# Patient Record
Sex: Male | Born: 1990 | Race: White | Hispanic: No | Marital: Married | Smoking: Never smoker
Health system: Southern US, Community
[De-identification: ages and names within clinical notes are randomized; demographics above are authoritative.]

## PROBLEM LIST (undated history)

## (undated) DIAGNOSIS — K8020 Calculus of gallbladder without cholecystitis without obstruction: Secondary | ICD-10-CM

## (undated) DIAGNOSIS — I10 Essential (primary) hypertension: Secondary | ICD-10-CM

## (undated) DIAGNOSIS — K7689 Other specified diseases of liver: Secondary | ICD-10-CM

## (undated) DIAGNOSIS — G4730 Sleep apnea, unspecified: Secondary | ICD-10-CM

## (undated) HISTORY — DX: Other specified diseases of liver: K76.89

## (undated) HISTORY — DX: Essential (primary) hypertension: I10

## (undated) HISTORY — PX: CHOLECYSTECTOMY: SHX55

---
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
## 2009-09-01 ENCOUNTER — Emergency Department (HOSPITAL_COMMUNITY): Admission: EM | Admit: 2009-09-01 | Discharge: 2009-09-01 | Payer: Self-pay | Admitting: Emergency Medicine

## 2011-01-21 LAB — URINALYSIS, ROUTINE W REFLEX MICROSCOPIC
Bilirubin Urine: NEGATIVE
Glucose, UA: NEGATIVE
Hgb urine dipstick: NEGATIVE
Ketones, ur: NEGATIVE
Nitrite: NEGATIVE
Protein, ur: NEGATIVE
Specific Gravity, Urine: 1.014
Urobilinogen, UA: 1
pH: 7

## 2011-01-21 LAB — CBC
HCT: 41.8
Hemoglobin: 14.4
MCHC: 34.3
MCV: 89.1
Platelets: 319
RBC: 4.7
RDW: 13
WBC: 17.3 — ABNORMAL HIGH

## 2011-01-21 LAB — COMPREHENSIVE METABOLIC PANEL
ALT: 48
AST: 52 — ABNORMAL HIGH
Albumin: 4.6
Alkaline Phosphatase: 77
BUN: 9
CO2: 26
Calcium: 9.8
Chloride: 103
Creatinine, Ser: 0.96
GFR calc Af Amer: >60
GFR calc non Af Amer: >60
Glucose, Bld: 93
Potassium: 3.9
Sodium: 140
Total Bilirubin: 0.9
Total Protein: 7

## 2011-01-21 LAB — DIFFERENTIAL
Basophils Absolute: 0.1
Basophils Relative: 0
Eosinophils Absolute: 0.1
Eosinophils Relative: 0
Lymphocytes Relative: 11 — ABNORMAL LOW
Lymphs Abs: 1.9
Monocytes Absolute: 1.5 — ABNORMAL HIGH
Monocytes Relative: 9
Neutro Abs: 13.7 — ABNORMAL HIGH
Neutrophils Relative %: 80 — ABNORMAL HIGH

## 2011-01-21 LAB — LIPASE, BLOOD: Lipase: 25

## 2013-03-11 ENCOUNTER — Emergency Department (HOSPITAL_COMMUNITY)
Admission: EM | Admit: 2013-03-11 | Discharge: 2013-03-12 | Disposition: A | Discharge status: Home / Self Care | Payer: Self-pay | Attending: Emergency Medicine | Admitting: Emergency Medicine

## 2013-03-11 ENCOUNTER — Encounter (HOSPITAL_COMMUNITY): Payer: Self-pay | Admitting: Emergency Medicine

## 2013-03-11 DIAGNOSIS — S61209A Unspecified open wound of unspecified finger without damage to nail, initial encounter: Secondary | ICD-10-CM | POA: Insufficient documentation

## 2013-03-11 DIAGNOSIS — Y9389 Activity, other specified: Secondary | ICD-10-CM | POA: Insufficient documentation

## 2013-03-11 DIAGNOSIS — S61219A Laceration without foreign body of unspecified finger without damage to nail, initial encounter: Secondary | ICD-10-CM

## 2013-03-11 DIAGNOSIS — Y990 Civilian activity done for income or pay: Secondary | ICD-10-CM | POA: Insufficient documentation

## 2013-03-11 DIAGNOSIS — W268XXA Contact with other sharp object(s), not elsewhere classified, initial encounter: Secondary | ICD-10-CM | POA: Insufficient documentation

## 2013-03-11 DIAGNOSIS — Y9289 Other specified places as the place of occurrence of the external cause: Secondary | ICD-10-CM | POA: Insufficient documentation

## 2013-03-11 DIAGNOSIS — Z23 Encounter for immunization: Secondary | ICD-10-CM | POA: Insufficient documentation

## 2013-03-11 MED ORDER — LIDOCAINE HCL (PF) 1 % IJ SOLN
5.0000 mL | Freq: Once | INTRAMUSCULAR | Status: AC
  Administered 2013-03-12: 5 mL
  Filled 2013-03-11: qty 5

## 2013-03-11 MED ORDER — TETANUS-DIPHTH-ACELL PERTUSSIS 5-2.5-18.5 LF-MCG/0.5 IM SUSP
0.5000 mL | Freq: Once | INTRAMUSCULAR | Status: AC
  Administered 2013-03-12: 0.5 mL via INTRAMUSCULAR
  Filled 2013-03-11: qty 0.5

## 2013-03-11 NOTE — ED Notes (Signed)
RIF lac 3pm today on a razor at work.

## 2013-03-12 MED ORDER — HYDROCODONE-ACETAMINOPHEN 5-325 MG PO TABS: 1.0000 | ORAL_TABLET | ORAL | Status: DC | PRN

## 2013-03-12 MED ORDER — HYDROCODONE-ACETAMINOPHEN 5-325 MG PO TABS: 2.0000 | ORAL_TABLET | ORAL | Status: DC | PRN

## 2013-03-12 NOTE — ED Provider Notes (Signed)
CSN: [id number]     Arrival date & time 03/11/13  2255 History   First MD Initiated Contact with Patient 03/11/13 2259     Chief Complaint  Patient presents with  . Laceration   (Consider location/radiation/quality/duration/timing/severity/associated sxs/prior Treatment) Patient is a 22 y.o. male presenting with skin laceration. The history is provided by the patient.  Laceration Location:  Hand Hand laceration location:  R finger Length (cm):  2.3 Depth:  Cutaneous Bleeding: controlled   Time since incident:  7 hours Laceration mechanism:  Razor Pain details:    Quality:  Throbbing   Severity:  Moderate   Timing:  Constant   Progression:  Worsening Foreign body present:  No foreign bodies Relieved by:  Nothing Ineffective treatments:  None tried Tetanus status:  Out of date   History reviewed. No pertinent past medical history. History reviewed. No pertinent past surgical history. History reviewed. No pertinent family history. History  Substance Use Topics  . Smoking status: Never Smoker   . Smokeless tobacco: Not on file  . Alcohol Use: Yes     Comment: rarely    Review of Systems  Constitutional: Negative for activity change.       All ROS Neg except as noted in HPI  HENT: Negative for nosebleeds.   Eyes: Negative for photophobia and discharge.  Respiratory: Negative for cough, shortness of breath and wheezing.   Cardiovascular: Negative for chest pain and palpitations.  Gastrointestinal: Negative for abdominal pain and blood in stool.  Genitourinary: Negative for dysuria, frequency and hematuria.  Musculoskeletal: Negative for arthralgias, back pain and neck pain.  Skin: Negative.   Neurological: Negative for dizziness, seizures and speech difficulty.  Psychiatric/Behavioral: Negative for hallucinations and confusion.    Allergies  Review of patient's allergies indicates no known allergies.  Home Medications  No current outpatient prescriptions on  file. BP 148/97  Pulse 88  Temp(Src) 98.1 F (36.7 C) (Oral)  Resp 18  Ht 5\' 10"  (1.778 m)  Wt 320 lb (145.151 kg)  BMI 45.92 kg/m2  SpO2 99% Physical Exam  Nursing note and vitals reviewed. Constitutional: He is oriented to person, place, and time. He appears well-developed and well-nourished.  Non-toxic appearance.  HENT:  Head: Normocephalic.  Right Ear: Tympanic membrane and external ear normal.  Left Ear: Tympanic membrane and external ear normal.  Eyes: EOM and lids are normal. Pupils are equal, round, and reactive to light.  Neck: Normal range of motion. Neck supple. Carotid bruit is not present.  Cardiovascular: Normal rate, regular rhythm, normal heart sounds, intact distal pulses and normal pulses.   Pulmonary/Chest: Breath sounds normal. No respiratory distress.  Abdominal: Soft. Bowel sounds are normal. There is no tenderness. There is no guarding.  Musculoskeletal: Normal range of motion.  Laceration of the lateral/palmar surface of the right index finger. No tendon or bone involvement.  Lymphadenopathy:       Head (right side): No submandibular adenopathy present.       Head (left side): No submandibular adenopathy present.    He has no cervical adenopathy.  Neurological: He is alert and oriented to person, place, and time. He has normal strength. No cranial nerve deficit or sensory deficit.  Skin: Skin is warm and dry.  Psychiatric: He has a normal mood and affect. His speech is normal.    ED Course  LACERATION REPAIR Date/Time: 03/12/2013 12:38 AM Performed by: Kathie Dike Authorized by: Kathie Dike Consent: Verbal consent obtained. Risks and benefits: risks, benefits  and alternatives were discussed Consent given by: patient Patient identity confirmed: arm band Time out: Immediately prior to procedure a "time out" was called to verify the correct patient, procedure, equipment, support staff and site/side marked as required. Body area: upper  extremity Location details: right index finger Laceration length: 2.3 cm Foreign bodies: no foreign bodies Tendon involvement: none Nerve involvement: none Anesthesia: digital block Local anesthetic: lidocaine 1% without epinephrine Preparation: Patient was prepped and draped in the usual sterile fashion. Irrigation solution: saline Amount of cleaning: standard Skin closure: 4-0 nylon Number of sutures: 6 Technique: simple Approximation: close Approximation difficulty: simple Dressing: gauze roll Patient tolerance: Patient tolerated the procedure well with no immediate complications.   (including critical care time) Labs Review Labs Reviewed - No data to display Imaging Review No results found.  EKG Interpretation   None       MDM  No diagnosis found. **I have reviewed nursing notes, vital signs, and all appropriate lab and imaging results for this patient.* No bone or tendon involvement from the laceration to the Rt index finger. Laceration to the right index finger repaired. Tetanus updated. Pt will return in 7 to 8 days for suture removal. He will return sooner if any changes  Kathie Dike, PA-C 03/13/13 1133

## 2013-03-13 NOTE — ED Provider Notes (Signed)
Medical screening examination/treatment/procedure(s) were performed by non-physician practitioner and as supervising physician I was immediately available for consultation/collaboration.    Sunnie Nielsen, MD 03/13/13 [phone]

## 2013-03-21 MED FILL — Hydrocodone-Acetaminophen Tab 5-325 MG: ORAL | Qty: 6 | Status: AC

## 2017-04-29 ENCOUNTER — Encounter: Payer: Self-pay | Admitting: Family Medicine

## 2017-04-29 ENCOUNTER — Ambulatory Visit (INDEPENDENT_AMBULATORY_CARE_PROVIDER_SITE_OTHER): Payer: Commercial Managed Care - PPO | Admitting: Family Medicine

## 2017-04-29 VITALS — BP 134/88 | Temp 98.0°F | Ht 70.0 in | Wt 314.8 lb

## 2017-04-29 DIAGNOSIS — Z79899 Other long term (current) drug therapy: Secondary | ICD-10-CM | POA: Diagnosis not present

## 2017-04-29 DIAGNOSIS — M109 Gout, unspecified: Secondary | ICD-10-CM | POA: Diagnosis not present

## 2017-04-29 DIAGNOSIS — Z13220 Encounter for screening for lipoid disorders: Secondary | ICD-10-CM | POA: Diagnosis not present

## 2017-04-29 MED ORDER — PREDNISONE 20 MG PO TABS: ORAL_TABLET | ORAL | 0 refills | Status: DC

## 2017-04-29 NOTE — Patient Instructions (Addendum)
Is some scarring and your prednisone taper and some blood work on low-Purine Diet Purines are compounds that affect the level of uric acid in your body. A low-purine diet is a diet that is low in purines. Eating a low-purine diet can prevent the level of uric acid in your body from getting too high and causing gout or kidney stones or both. What do I need to know about this diet?  Choose low-purine foods. Examples of low-purine foods are listed in the next section.  Drink plenty of fluids, especially water. Fluids can help remove uric acid from your body. Try to drink 8-16 cups (1.9-3.8 L) a day.  Limit foods high in fat, especially saturated fat, as fat makes it harder for the body to get rid of uric acid. Foods high in saturated fat include pizza, cheese, ice cream, whole milk, fried foods, and gravies. Choose foods that are lower in fat and lean sources of protein. Use olive oil when cooking as it contains healthy fats that are not high in saturated fat.  Limit alcohol. Alcohol interferes with the elimination of uric acid from your body. If you are having a gout attack, avoid all alcohol.  Keep in mind that different people's bodies react differently to different foods. You will probably learn over time which foods do or do not affect you. If you discover that a food tends to cause your gout to flare up, avoid eating that food. You can more freely enjoy foods that do not cause problems. If you have any questions about a food item, talk to your dietitian or health care provider. Which foods are low, moderate, and high in purines? The following is a list of foods that are low, moderate, and high in purines. You can eat any amount of the foods that are low in purines. You may be able to have small amounts of foods that are moderate in purines. Ask your health care provider how much of a food moderate in purines you can have. Avoid foods high in purines. Grains  Foods low in purines: Enriched white  bread, pasta, rice, cake, cornbread, popcorn.  Foods moderate in purines: Whole-grain breads and cereals, wheat germ, bran, oatmeal. Uncooked oatmeal. Dry wheat bran or wheat germ.  Foods high in purines: Pancakes, [place] toast, biscuits, muffins. Vegetables  Foods low in purines: All vegetables, except those that are moderate in purines.  Foods moderate in purines: Asparagus, cauliflower, spinach, mushrooms, green peas. Fruits  All fruits are low in purines. Meats and other Protein Foods  Foods low in purines: Eggs, nuts, peanut butter.  Foods moderate in purines: 80-90% lean beef, lamb, veal, pork, poultry, fish, eggs, peanut butter, nuts. Crab, lobster, oysters, and shrimp. Cooked dried beans, peas, and lentils.  Foods high in purines: Anchovies, sardines, herring, mussels, tuna, codfish, scallops, trout, and haddock. Marvin Rivera. Organ meats (such as liver or kidney). Tripe. Game meat. Goose. Sweetbreads. Dairy  All dairy foods are low in purines. Low-fat and fat-free dairy products are best because they are low in saturated fat. Beverages  Drinks low in purines: Water, carbonated beverages, tea, coffee, cocoa.  Drinks moderate in purines: Soft drinks and other drinks sweetened with high-fructose corn syrup. Juices. To find whether a food or drink is sweetened with high-fructose corn syrup, look at the ingredients list.  Drinks high in purines: Alcoholic beverages (such as beer). Condiments  Foods low in purines: Salt, herbs, olives, pickles, relishes, vinegar.  Foods moderate in purines: Butter, margarine, oils, mayonnaise.  Fats and Oils  Foods low in purines: All types, except gravies and sauces made with meat.  Foods high in purines: Gravies and sauces made with meat. Other Foods  Foods low in purines: Sugars, sweets, gelatin. Cake. Soups made without meat.  Foods moderate in purines: Meat-based or fish-based soups, broths, or bouillons. Foods and drinks sweetened with  high-fructose corn syrup.  Foods high in purines: High-fat desserts (such as ice cream, cookies, cakes, pies, doughnuts, and chocolate). Contact your dietitian for more information on foods that are not listed here. This information is not intended to replace advice given to you by your health care provider. Make sure you discuss any questions you have with your health care provider. Document Released: 08/09/2010 Document Revised: 09/20/2015 Document Reviewed: 03/21/2013 Elsevier Interactive Patient Education  2017 [employer] Gout Gout is painful swelling that can occur in some of your joints. Gout is a type of arthritis. This condition is caused by having too much uric acid in your body. Uric acid is a chemical that forms when your body breaks down substances called purines. Purines are important for building body proteins. When your body has too much uric acid, sharp crystals can form and build up inside your joints. This causes pain and swelling. Gout attacks can happen quickly and be very painful (acute gout). Over time, the attacks can affect more joints and become more frequent (chronic gout). Gout can also cause uric acid to build up under your skin and inside your kidneys. What are the causes? This condition is caused by too much uric acid in your blood. This can occur because:  Your kidneys do not remove enough uric acid from your blood. This is the most common cause.  Your body makes too much uric acid. This can occur with some cancers and cancer treatments. It can also occur if your body is breaking down too many red blood cells (hemolytic anemia).  You eat too many foods that are high in purines. These foods include organ meats and some seafood. Alcohol, especially beer, is also high in purines.  A gout attack may be triggered by trauma or stress. What increases the risk? This condition is more likely to develop in people who:  Have a family history of gout.  Are male and  middle-aged.  Are male and have gone through menopause.  Are obese.  Frequently drink alcohol, especially beer.  Are dehydrated.  Lose weight too quickly.  Have an organ transplant.  Have lead poisoning.  Take certain medicines, including aspirin, cyclosporine, diuretics, levodopa, and niacin.  Have kidney disease or psoriasis.  What are the signs or symptoms? An attack of acute gout happens quickly. It usually occurs in just one joint. The most common place is the big toe. Attacks often start at night. Other joints that may be affected include joints of the feet, ankle, knee, fingers, wrist, or elbow. Symptoms may include:  Severe pain.  Warmth.  Swelling.  Stiffness.  Tenderness. The affected joint may be very painful to touch.  Shiny, red, or purple skin.  Chills and fever.  Chronic gout may cause symptoms more frequently. More joints may be involved. You may also have white or yellow lumps (tophi) on your hands or feet or in other areas near your joints. How is this diagnosed? This condition is diagnosed based on your symptoms, medical history, and physical exam. You may have tests, such as:  Blood tests to measure uric acid levels.  Removal of joint fluid  with a needle (aspiration) to look for uric acid crystals.  X-rays to look for joint damage.  How is this treated? Treatment for this condition has two phases: treating an acute attack and preventing future attacks. Acute gout treatment may include medicines to reduce pain and swelling, including:  NSAIDs.  Steroids. These are strong anti-inflammatory medicines that can be taken by mouth (orally) or injected into a joint.  Colchicine. This medicine relieves pain and swelling when it is taken soon after an attack. It can be given orally or through an IV tube.  Preventive treatment may include:  Daily use of smaller doses of NSAIDs or colchicine.  Use of a medicine that reduces uric acid levels in  your blood.  Changes to your diet. You may need to see a specialist about healthy eating (dietitian).  Follow these instructions at home: During a Gout Attack  If directed, apply ice to the affected area: ? Put ice in a plastic bag. ? Place a towel between your skin and the bag. ? Leave the ice on for 20 minutes, 2-3 times a day.  Rest the joint as much as possible. If the affected joint is in your leg, you may be given crutches to use.  Raise (elevate) the affected joint above the level of your heart as often as possible.  Drink enough fluids to keep your urine clear or pale yellow.  Take over-the-counter and prescription medicines only as told by your health care provider.  Do not drive or operate heavy machinery while taking prescription pain medicine.  Follow instructions from your health care provider about eating or drinking restrictions.  Return to your normal activities as told by your health care provider. Ask your health care provider what activities are safe for you. Avoiding Future Gout Attacks  Follow a low-purine diet as told by your dietitian or health care provider. Avoid foods and drinks that are high in purines, including liver, kidney, anchovies, asparagus, herring, mushrooms, mussels, and beer.  Limit alcohol intake to no more than 1 drink a day for nonpregnant women and 2 drinks a day for men. One drink equals 12 oz of beer, 5 oz of wine, or 1 oz of hard liquor.  Maintain a healthy weight or lose weight if you are overweight. If you want to lose weight, talk with your health care provider. It is important that you do not lose weight too quickly.  Start or maintain an exercise program as told by your health care provider.  Drink enough fluids to keep your urine clear or pale yellow.  Take over-the-counter and prescription medicines only as told by your health care provider.  Keep all follow-up visits as told by your health care provider. This is  important. Contact a health care provider if:  You have another gout attack.  You continue to have symptoms of a gout attack after10 days of treatment.  You have side effects from your medicines.  You have chills or a fever.  You have burning pain when you urinate.  You have pain in your lower back or belly. Get help right away if:  You have severe or uncontrolled pain.  You cannot urinate. This information is not intended to replace advice given to you by your health care provider. Make sure you discuss any questions you have with your health care provider. Document Released: 04/11/2000 Document Revised: 09/20/2015 Document Reviewed: 01/25/2015 Elsevier Interactive Patient Education  [employer].

## 2017-04-29 NOTE — Progress Notes (Signed)
   Subjective:    Patient ID: Marvin Rivera, male    DOB: 1991/02/13, 27 y.o.   MRN: [id number]  HPIpain in right great toe. Started 2 days ago.    Foot hurts intermittent painful  Trouble with walkin   No fam hx    wonders if may be infected or bunion, now ewonrring if it msy be gout   Had a liver cyst 10 years ago he has not followed up on.   Would like screening bloodwork.   Declines flu vaccine.       Review of Systems No headache, no major weight loss or weight gain, no chest pain no back pain abdominal pain no change in bowel habits complete ROS otherwise negative     Objective:   Physical Exam  Alert vitals stable, NAD. Blood pressure good on repeat. HEENT normal. Lungs clear. Heart regular rate and rhythm. Right great toe substantial inflammation and warmth and tenderness      Assessment & Plan:  Impression 1 probable gout discussed.  He has had intermittent flares  2.  History of liver hemangioma discovered on CT scan nearly 10 years ago.  At that time advised to get a MRI.  Having no symptoms to liver.  Chance of this being something serious this many years later is very low.  At this time will hold off on further scans  Appropriate blood work  Prednisone taper  Uric acid return high normal.  I still feel this represents true counts

## 2017-04-30 LAB — HEPATIC FUNCTION PANEL
ALT: 32 IU/L (ref 0–44)
AST: 25 IU/L (ref 0–40)
Albumin: 4.8 g/dL (ref 3.5–5.5)
Alkaline Phosphatase: 73 IU/L (ref 39–117)
Bilirubin Total: 1.2 mg/dL (ref 0.0–1.2)
Bilirubin, Direct: 0.23 mg/dL (ref 0.00–0.40)
Total Protein: 7.7 g/dL (ref 6.0–8.5)

## 2017-04-30 LAB — LIPID PANEL
Chol/HDL Ratio: 5.4 ratio — ABNORMAL HIGH (ref 0.0–5.0)
Cholesterol, Total: 226 mg/dL — ABNORMAL HIGH (ref 100–199)
HDL: 42 mg/dL (ref >39)
LDL Calculated: 129 mg/dL — ABNORMAL HIGH (ref 0–99)
Triglycerides: 274 mg/dL — ABNORMAL HIGH (ref 0–149)
VLDL Cholesterol Cal: 55 mg/dL — ABNORMAL HIGH (ref 5–40)

## 2017-04-30 LAB — BASIC METABOLIC PANEL
BUN/Creatinine Ratio: 15 (ref 9–20)
BUN: 15 mg/dL (ref 6–20)
CO2: 24 mmol/L (ref 20–29)
Calcium: 10.1 mg/dL (ref 8.7–10.2)
Chloride: 99 mmol/L (ref 96–106)
Creatinine, Ser: 1.02 mg/dL (ref 0.76–1.27)
GFR calc Af Amer: 115 mL/min/{1.73_m2} (ref >59)
GFR calc non Af Amer: 100 mL/min/{1.73_m2} (ref >59)
Glucose: 83 mg/dL (ref 65–99)
Potassium: 4.3 mmol/L (ref 3.5–5.2)
Sodium: 139 mmol/L (ref 134–144)

## 2017-04-30 LAB — URIC ACID: Uric Acid: 7.8 mg/dL (ref 3.7–8.6)

## 2017-05-04 ENCOUNTER — Encounter: Payer: Self-pay | Admitting: Family Medicine

## 2017-05-06 ENCOUNTER — Telehealth: Payer: Self-pay | Admitting: Family Medicine

## 2017-05-06 NOTE — Telephone Encounter (Signed)
I called and left a message to r/c. The letter is in epic to read to him. He should be getting soon in the mail.

## 2017-05-06 NOTE — Telephone Encounter (Signed)
I read the letter to the patient and he is aware of all.

## 2017-05-06 NOTE — Telephone Encounter (Signed)
Requesting results to recent labs. °

## 2017-09-03 ENCOUNTER — Other Ambulatory Visit: Payer: Self-pay

## 2017-09-03 ENCOUNTER — Encounter (HOSPITAL_COMMUNITY): Payer: Self-pay | Admitting: Emergency Medicine

## 2017-09-03 ENCOUNTER — Emergency Department (HOSPITAL_COMMUNITY)
Admission: EM | Admit: 2017-09-03 | Discharge: 2017-09-03 | Disposition: A | Discharge status: Home / Self Care | Payer: Commercial Managed Care - PPO | Attending: Emergency Medicine | Admitting: Emergency Medicine

## 2017-09-03 DIAGNOSIS — Y999 Unspecified external cause status: Secondary | ICD-10-CM | POA: Insufficient documentation

## 2017-09-03 DIAGNOSIS — Y9389 Activity, other specified: Secondary | ICD-10-CM | POA: Insufficient documentation

## 2017-09-03 DIAGNOSIS — S20361A Insect bite (nonvenomous) of right front wall of thorax, initial encounter: Secondary | ICD-10-CM | POA: Diagnosis not present

## 2017-09-03 DIAGNOSIS — W57XXXA Bitten or stung by nonvenomous insect and other nonvenomous arthropods, initial encounter: Secondary | ICD-10-CM | POA: Diagnosis not present

## 2017-09-03 DIAGNOSIS — I10 Essential (primary) hypertension: Secondary | ICD-10-CM | POA: Diagnosis not present

## 2017-09-03 DIAGNOSIS — T7840XA Allergy, unspecified, initial encounter: Secondary | ICD-10-CM | POA: Diagnosis not present

## 2017-09-03 DIAGNOSIS — S299XXA Unspecified injury of thorax, initial encounter: Secondary | ICD-10-CM | POA: Diagnosis present

## 2017-09-03 DIAGNOSIS — Y9289 Other specified places as the place of occurrence of the external cause: Secondary | ICD-10-CM | POA: Diagnosis not present

## 2017-09-03 DIAGNOSIS — S1086XA Insect bite of other specified part of neck, initial encounter: Secondary | ICD-10-CM | POA: Insufficient documentation

## 2017-09-03 LAB — COMPREHENSIVE METABOLIC PANEL
ALT: 36 U/L (ref 17–63)
AST: 35 U/L (ref 15–41)
Albumin: 4.7 g/dL (ref 3.5–5.0)
Alkaline Phosphatase: 51 U/L (ref 38–126)
Anion gap: 7 (ref 5–15)
BUN: 20 mg/dL (ref 6–20)
CO2: 26 mmol/L (ref 22–32)
Calcium: 9.5 mg/dL (ref 8.9–10.3)
Chloride: 104 mmol/L (ref 101–111)
Creatinine, Ser: 1.04 mg/dL (ref 0.61–1.24)
GFR calc Af Amer: >60 mL/min (ref >60)
GFR calc non Af Amer: >60 mL/min (ref >60)
Glucose, Bld: 108 mg/dL — ABNORMAL HIGH (ref 65–99)
Potassium: 3.7 mmol/L (ref 3.5–5.1)
Sodium: 137 mmol/L (ref 135–145)
Total Bilirubin: 1.9 mg/dL — ABNORMAL HIGH (ref 0.3–1.2)
Total Protein: 7.8 g/dL (ref 6.5–8.1)

## 2017-09-03 LAB — CBC WITH DIFFERENTIAL/PLATELET
Basophils Absolute: 0 10*3/uL (ref 0.0–0.1)
Basophils Relative: 0 %
Eosinophils Absolute: 0.1 10*3/uL (ref 0.0–0.7)
Eosinophils Relative: 1 %
HCT: 45.1 % (ref 39.0–52.0)
Hemoglobin: 15 g/dL (ref 13.0–17.0)
Lymphocytes Relative: 24 %
Lymphs Abs: 2.3 10*3/uL (ref 0.7–4.0)
MCH: 29.6 pg (ref 26.0–34.0)
MCHC: 33.3 g/dL (ref 30.0–36.0)
MCV: 89 fL (ref 78.0–100.0)
Monocytes Absolute: 0.6 10*3/uL (ref 0.1–1.0)
Monocytes Relative: 6 %
Neutro Abs: 6.8 10*3/uL (ref 1.7–7.7)
Neutrophils Relative %: 69 %
Platelets: 312 10*3/uL (ref 150–400)
RBC: 5.07 MIL/uL (ref 4.22–5.81)
RDW: 13.1 % (ref 11.5–15.5)
WBC: 9.7 10*3/uL (ref 4.0–10.5)

## 2017-09-03 LAB — TROPONIN I: Troponin I: <0.03 ng/mL (ref <0.03)

## 2017-09-03 MED ORDER — FAMOTIDINE 40 MG PO TABS: 40.0000 mg | ORAL_TABLET | Freq: Every day | ORAL | 0 refills | Status: DC

## 2017-09-03 MED ORDER — EPINEPHRINE 0.3 MG/0.3ML IJ SOAJ: 0.3000 mg | Freq: Once | INTRAMUSCULAR | 0 refills | Status: AC

## 2017-09-03 MED ORDER — GI COCKTAIL ~~LOC~~
30.0000 mL | Freq: Once | ORAL | Status: AC
  Administered 2017-09-03: 30 mL via ORAL
  Filled 2017-09-03: qty 30

## 2017-09-03 MED ORDER — METHYLPREDNISOLONE SODIUM SUCC 125 MG IJ SOLR
125.0000 mg | Freq: Once | INTRAMUSCULAR | Status: AC
  Administered 2017-09-03: 125 mg via INTRAVENOUS
  Filled 2017-09-03: qty 2

## 2017-09-03 MED ORDER — SUCRALFATE 1 GM/10ML PO SUSP: 1.0000 g | Freq: Three times a day (TID) | ORAL | 0 refills | Status: DC

## 2017-09-03 MED ORDER — PREDNISONE 10 MG PO TABS: 20.0000 mg | ORAL_TABLET | Freq: Every day | ORAL | 0 refills | Status: AC

## 2017-09-03 MED ORDER — FAMOTIDINE IN NACL 20-0.9 MG/50ML-% IV SOLN
20.0000 mg | Freq: Once | INTRAVENOUS | Status: AC
  Administered 2017-09-03: 20 mg via INTRAVENOUS
  Filled 2017-09-03: qty 50

## 2017-09-03 MED ORDER — SODIUM CHLORIDE 0.9 % IV BOLUS
1000.0000 mL | Freq: Once | INTRAVENOUS | Status: AC
  Administered 2017-09-03: 1000 mL via INTRAVENOUS

## 2017-09-03 NOTE — ED Provider Notes (Signed)
Emergency Department Provider Note   I have reviewed the triage vital signs and the nursing notes.   HISTORY  Chief Complaint Insect Bite   HPI Marvin Rivera is a 27 y.o. male with PMH of HTN presents to the emergency department for evaluation of possible insect envenomation.  Patient was working in a crawl space today when afterwards he felt a sharp pain near his right shoulder and neck.  Patient was driving home at the time and pulled over immediately.  He felt to additional sharp pains in the right side.  He removed his shirt but did not see any insects.  He returned home where he began having pain at the sites along with redness.  He had some tingling in the face along with chest discomfort and some epigastric abdominal discomfort.  He states that as a child he had some swelling when being stung by yellow jackets but has never had anaphylaxis.  His wife gave him Benadryl and his symptoms are improving slightly but he continues to have some substernal chest discomfort.   Past Medical History:  Diagnosis Date  . Hypertension   . Liver cyst 2009    There are no active problems to display for this patient.   History reviewed. No pertinent surgical history.  Current Outpatient Rx  . Order #: [id number] Class: Historical Med  . Order #: [id number] Class: Historical Med  . Order #: [id number] Class: Print  . Order #: [id number] Class: Print  . [START ON 09/04/2017] Order #: [id number] Class: Print  . Order #: [id number] Class: Print    Allergies Patient has no known allergies.  Family History  Problem Relation Age of Onset  . Lung cancer Mother   . Atrial fibrillation Mother   . Hypertension Mother   . Atrial fibrillation Father   . Heart disease Paternal Grandmother   . Other Paternal Grandmother        carney complex  . Lung cancer Paternal Grandfather     Social History Social History   Tobacco Use  . Smoking status: Never Smoker  . Smokeless tobacco: Never Used    Substance Use Topics  . Alcohol use: Yes    Comment: rarely  . Drug use: No    Review of Systems  Constitutional: No fever/chills Eyes: No visual changes. ENT: No sore throat. Cardiovascular: Positive chest pain. Respiratory: Denies shortness of breath. Gastrointestinal: Positive epigastric abdominal pain. Positive nausea, no vomiting.  No diarrhea.  No constipation. Genitourinary: Negative for dysuria. Musculoskeletal: Negative for back pain. Skin: Positive rash with some pain and mild swelling.  Neurological: Negative for headaches, focal weakness or numbness.  10-point ROS otherwise negative.  ____________________________________________   PHYSICAL EXAM:  VITAL SIGNS: ED Triage Vitals  Enc Vitals Group     BP 09/03/17 1819 (!) 132/96     Pulse Rate 09/03/17 1819 (!) 103     Resp 09/03/17 1819 16     Temp 09/03/17 1819 98.5 F (36.9 C)     Temp Source 09/03/17 1819 Oral     SpO2 09/03/17 1819 99 %     Weight 09/03/17 1820 (!) 305 lb (138.3 kg)     Height 09/03/17 1820  (1.778 m)     Pain Score 09/03/17 1819 8   Constitutional: Alert and oriented. Well appearing and in no acute distress. Eyes: Conjunctivae are normal.  Head: Atraumatic. Nose: No congestion/rhinnorhea. Mouth/Throat: Mucous membranes are moist.   Neck: No stridor. Cardiovascular: Normal rate, regular rhythm. Good peripheral circulation. Grossly normal  heart sounds.   Respiratory: Normal respiratory effort.  No retractions. Lungs CTAB. Gastrointestinal: Soft and nontender. No distention.  Musculoskeletal: No lower extremity tenderness nor edema. No gross deformities of extremities. Neurologic:  Normal speech and language. No gross focal neurologic deficits are appreciated.  Skin:  Skin is warm, dry and intact. Mild erythema over the right lateral neck and right lateral chest. No abscess. No cellulitis.   ____________________________________________   LABS (all labs ordered are listed, but  only abnormal results are displayed)  Labs Reviewed  COMPREHENSIVE METABOLIC PANEL - Abnormal; Notable for the following components:      Result Value   Glucose, Bld 108 (*)    Total Bilirubin 1.9 (*)    All other components within normal limits  TROPONIN I  CBC WITH DIFFERENTIAL/PLATELET   ____________________________________________   PROCEDURES  Procedure(s) performed:   Procedures  None ____________________________________________   INITIAL IMPRESSION / ASSESSMENT AND PLAN / ED COURSE  Pertinent labs & imaging results that were available during my care of the patient were reviewed by me and considered in my medical decision making (see chart for details).  Patient presents to the emergency department for evaluation of possible insect bite to the right neck and right lateral chest.  Symptoms began 1 hour prior to ED presentation.  Is having some associated chest discomfort after this.  His airway is widely patent.  No difficulty breathing.  No indication for EpiPen at this time.  Plan for IV fluids, Solu-Medrol, Pepcid.  Patient received Benadryl prior to arrival. Given CP plan for EKG and baseline labs including troponin.   Patient labs reviewed with no acute findings.  Patient feeling better after symptomatic treatment.  Discharged home with short steroid course, Benadryl, Pepcid, EpiPen Rx.   At this time, I do not feel there is any life-threatening condition present. I have reviewed and discussed all results (EKG, imaging, lab, urine as appropriate), exam findings with patient. I have reviewed nursing notes and appropriate previous records.  I feel the patient is safe to be discharged home without further emergent workup. Discussed usual and customary return precautions. Patient and family (if present) verbalize understanding and are comfortable with this plan.  Patient will follow-up with their primary care provider. If they do not have a primary care provider, information  for follow-up has been provided to them. All questions have been answered.  ____________________________________________  FINAL CLINICAL IMPRESSION(S) / ED DIAGNOSES  Final diagnoses:  Insect bite of right front wall of thorax, initial encounter  Allergic reaction, initial encounter     MEDICATIONS GIVEN DURING THIS VISIT:  Medications  sodium chloride 0.9 % bolus 1,000 mL (0 mLs Intravenous Stopped 09/03/17 1949)  methylPREDNISolone sodium succinate (SOLU-MEDROL) 125 mg/2 mL injection 125 mg (125 mg Intravenous Given 09/03/17 1848)  famotidine (PEPCID) IVPB 20 mg premix (0 mg Intravenous Stopped 09/03/17 1919)  gi cocktail (Maalox,Lidocaine,Donnatal) (30 mLs Oral Given 09/03/17 1947)     NEW OUTPATIENT MEDICATIONS STARTED DURING THIS VISIT:  Discharge Medication List as of 09/03/2017  8:17 PM    START taking these medications   Details  EPINEPHrine (EPIPEN 2-PAK) 0.3 mg/0.3 mL IJ SOAJ injection Inject 0.3 mLs (0.3 mg total) into the muscle once for 1 dose., Starting Thu 09/03/2017, Print    famotidine (PEPCID) 40 MG tablet Take 1 tablet (40 mg total) by mouth daily., Starting Thu 09/03/2017, Print    sucralfate (CARAFATE) 1 GM/10ML suspension Take 10 mLs (1 g total) by mouth 4 (four) times daily -  with meals and at bedtime., Starting Thu 09/03/2017, Print        Note:  This document was prepared using Dragon voice recognition software and may include unintentional dictation errors.  Alona Bene, MD Emergency Medicine    Nayel Purdy, Arlyss Repress, MD 09/03/17 2122

## 2017-09-03 NOTE — Discharge Instructions (Signed)

## 2017-09-03 NOTE — ED Triage Notes (Signed)
Pt was in a crawlspace, and felt something bite him on his neck and upper right abdomen.  Unsure of what bit pt. VSS.

## 2017-09-11 ENCOUNTER — Encounter: Payer: Self-pay | Admitting: Nurse Practitioner

## 2017-09-11 ENCOUNTER — Ambulatory Visit (INDEPENDENT_AMBULATORY_CARE_PROVIDER_SITE_OTHER): Payer: Commercial Managed Care - PPO | Admitting: Nurse Practitioner

## 2017-09-11 VITALS — BP 136/90 | Temp 98.1°F | Ht 70.0 in | Wt 305.0 lb

## 2017-09-11 DIAGNOSIS — R197 Diarrhea, unspecified: Secondary | ICD-10-CM

## 2017-09-11 DIAGNOSIS — Z13220 Encounter for screening for lipoid disorders: Secondary | ICD-10-CM | POA: Diagnosis not present

## 2017-09-11 DIAGNOSIS — Z79899 Other long term (current) drug therapy: Secondary | ICD-10-CM

## 2017-09-11 DIAGNOSIS — R1011 Right upper quadrant pain: Secondary | ICD-10-CM | POA: Diagnosis not present

## 2017-09-11 DIAGNOSIS — K219 Gastro-esophageal reflux disease without esophagitis: Secondary | ICD-10-CM

## 2017-09-11 NOTE — Patient Instructions (Signed)
Food Choices for Gastroesophageal Reflux Disease, Adult When you have gastroesophageal reflux disease (GERD), the foods you eat and your eating habits are very important. Choosing the right foods can help ease your discomfort. What guidelines do I need to follow?  Choose fruits, vegetables, whole grains, and low-fat dairy products.  Choose low-fat meat, fish, and poultry.  Limit fats such as oils, salad dressings, butter, nuts, and avocado.  Keep a food diary. This helps you identify foods that cause symptoms.  Avoid foods that cause symptoms. These may be different for everyone.  Eat small meals often instead of 3 large meals a day.  Eat your meals slowly, in a place where you are relaxed.  Limit fried foods.  Cook foods using methods other than frying.  Avoid drinking alcohol.  Avoid drinking large amounts of liquids with your meals.  Avoid bending over or lying down until 2-3 hours after eating. What foods are not recommended? These are some foods and drinks that may make your symptoms worse: Vegetables  Tomatoes. Tomato juice. Tomato and spaghetti sauce. Chili peppers. Onion and garlic. Horseradish. Fruits  Oranges, grapefruit, and lemon (fruit and juice). Meats  High-fat meats, fish, and poultry. This includes hot dogs, ribs, ham, sausage, salami, and bacon. Dairy  Whole milk and chocolate milk. Sour cream. Cream. Butter. Ice cream. Cream cheese. Drinks  Coffee and tea. Bubbly (carbonated) drinks or energy drinks. Condiments  Hot sauce. Barbecue sauce. Sweets/Desserts  Chocolate and cocoa. Donuts. Peppermint and spearmint. Fats and Oils  High-fat foods. This includes French fries and potato chips. Other  Vinegar. Strong spices. This includes black pepper, white pepper, red pepper, cayenne, curry powder, cloves, ginger, and chili powder. The items listed above may not be a complete list of foods and drinks to avoid. Contact your dietitian for more information.    This information is not intended to replace advice given to you by your health care provider. Make sure you discuss any questions you have with your health care provider. Document Released: 10/14/2011 Document Revised: 09/20/2015 Document Reviewed: 02/16/2013 Elsevier Interactive Patient Education  2017 Elsevier Inc.  

## 2017-09-13 ENCOUNTER — Encounter: Payer: Self-pay | Admitting: Nurse Practitioner

## 2017-09-13 DIAGNOSIS — K219 Gastro-esophageal reflux disease without esophagitis: Secondary | ICD-10-CM | POA: Insufficient documentation

## 2017-09-13 NOTE — Progress Notes (Signed)
Subjective: Presents with his wife to discuss chronic abdominal issues.  Of note, patient has been following his specialized diet and his gout has calmed down recently.  Defers repeat uric acid level at this time.  Complaints of multiple loose stools daily for the past 3 weeks.  This past week had an illness which included abdominal pain nausea and dizziness.  No fever.  States he was very tired in the bed for about 3 days.  Describes abdominal pain as a "burning knife" in the mid abdominal area.  No further pain at this time.  About twice a week will have significant "gas pain".  At this point is not having nausea vomiting or back pain.  No bloody stools.  No change in the color of his stools.  Has not identified any specific trigger for his abdominal symptoms, no pattern.  Unassociated with meals or particular foods.  After eating in 2 to 3 hours will have pain and diarrhea.  States he only has reflux with foods such as pizza or [employer].  Takes a Tums which helps if he eats any greasy foods.  No mid back pain.  No urinary symptoms.  Objective:   BP 136/90   Temp 98.1 F (36.7 C) (Oral)   Ht  (1.778 m)   Wt (!) 305 lb (138.3 kg)   BMI 43.76 kg/m  NAD.  Alert, oriented.  Lungs clear.  Heart regular rate and rhythm.  Abdomen obese soft nondistended with active bowel sounds x4.  Nontender to palpation.  No rebound or guarding.  No obvious masses organomegaly.  Assessment:   Problem List Items Addressed This Visit      Digestive   Gastroesophageal reflux disease without esophagitis    Other Visit Diagnoses    Right upper quadrant abdominal pain    -  Primary   Relevant Orders   US Abdomen Limited RUQ   CBC with Differential   Basic Metabolic Panel (BMET)   Lipase   Hepatic function panel   Diarrhea in adult patient       Relevant Orders   US Abdomen Limited RUQ   Screening for lipid disorders       Relevant Orders   Hepatic function panel   High risk medication use       Relevant Orders   Lipase   Hepatic function panel       Plan: Continue using Tums or OTC med for occasional reflux symptoms.  Lab work and ultrasound pending.  Warning signs reviewed.  Patient to call or go to ED in the meantime if symptoms worsen.   25 minutes was spent with the patient.  This statement verifies that 25 minutes was indeed spent with the patient. Greater than half the time was spent in discussion, counseling and answering questions  regarding the issues that the patient came in for today as reflected in the diagnosis (s) please refer to documentation for further details.

## 2017-09-17 ENCOUNTER — Ambulatory Visit (HOSPITAL_COMMUNITY)
Admission: RE | Admit: 2017-09-17 | Discharge: 2017-09-17 | Disposition: A | Discharge status: Home / Self Care | Payer: Commercial Managed Care - PPO | Source: Ambulatory Visit | Attending: Nurse Practitioner | Admitting: Nurse Practitioner

## 2017-09-17 DIAGNOSIS — R1011 Right upper quadrant pain: Secondary | ICD-10-CM | POA: Diagnosis present

## 2017-09-17 DIAGNOSIS — R932 Abnormal findings on diagnostic imaging of liver and biliary tract: Secondary | ICD-10-CM | POA: Diagnosis not present

## 2017-09-17 DIAGNOSIS — R197 Diarrhea, unspecified: Secondary | ICD-10-CM | POA: Insufficient documentation

## 2017-10-02 ENCOUNTER — Encounter: Payer: Self-pay | Admitting: Nurse Practitioner

## 2017-10-02 ENCOUNTER — Ambulatory Visit (INDEPENDENT_AMBULATORY_CARE_PROVIDER_SITE_OTHER): Payer: Commercial Managed Care - PPO | Admitting: Nurse Practitioner

## 2017-10-02 VITALS — BP 124/88 | Temp 98.3°F | Ht 70.0 in | Wt 311.0 lb

## 2017-10-02 DIAGNOSIS — K219 Gastro-esophageal reflux disease without esophagitis: Secondary | ICD-10-CM | POA: Diagnosis not present

## 2017-10-02 LAB — CBC WITH DIFFERENTIAL/PLATELET
BASOS ABS: 0.1 10*3/uL (ref 0.0–0.2)
Basos: 1 %
EOS (ABSOLUTE): 0.2 10*3/uL (ref 0.0–0.4)
Eos: 2 %
Hematocrit: 41.1 % (ref 37.5–51.0)
Hemoglobin: 14.1 g/dL (ref 13.0–17.7)
IMMATURE GRANULOCYTES: 0 %
Immature Grans (Abs): 0 10*3/uL (ref 0.0–0.1)
LYMPHS ABS: 2.8 10*3/uL (ref 0.7–3.1)
Lymphs: 33 %
MCH: 30.4 pg (ref 26.6–33.0)
MCHC: 34.3 g/dL (ref 31.5–35.7)
MCV: 89 fL (ref 79–97)
MONOS ABS: 0.8 10*3/uL (ref 0.1–0.9)
Monocytes: 9 %
NEUTROS PCT: 55 %
Neutrophils Absolute: 4.6 10*3/uL (ref 1.4–7.0)
PLATELETS: 310 10*3/uL (ref 150–450)
RBC: 4.64 x10E6/uL (ref 4.14–5.80)
RDW: 13.9 % (ref 12.3–15.4)
WBC: 8.3 10*3/uL (ref 3.4–10.8)

## 2017-10-02 LAB — HEPATIC FUNCTION PANEL
ALBUMIN: 5 g/dL (ref 3.5–5.5)
ALK PHOS: 68 IU/L (ref 39–117)
ALT: 25 IU/L (ref 0–44)
AST: 22 IU/L (ref 0–40)
Bilirubin Total: 1 mg/dL (ref 0.0–1.2)
Bilirubin, Direct: 0.2 mg/dL (ref 0.00–0.40)
TOTAL PROTEIN: 7.3 g/dL (ref 6.0–8.5)

## 2017-10-02 LAB — BASIC METABOLIC PANEL
BUN/Creatinine Ratio: 13 (ref 9–20)
BUN: 13 mg/dL (ref 6–20)
CALCIUM: 10.2 mg/dL (ref 8.7–10.2)
CHLORIDE: 102 mmol/L (ref 96–106)
CO2: 24 mmol/L (ref 20–29)
Creatinine, Ser: 1.03 mg/dL (ref 0.76–1.27)
GFR calc non Af Amer: 98 mL/min/{1.73_m2} (ref >59)
GFR, EST AFRICAN AMERICAN: 113 mL/min/{1.73_m2} (ref >59)
Glucose: 74 mg/dL (ref 65–99)
POTASSIUM: 4.3 mmol/L (ref 3.5–5.2)
SODIUM: 143 mmol/L (ref 134–144)

## 2017-10-02 LAB — LIPASE: Lipase: 49 U/L (ref 13–78)

## 2017-10-02 NOTE — Progress Notes (Signed)
Subjective: Presents for recheck on his reflux.  Symptoms are now under control.  Only takes medication as needed.  Reflux only acts up if he eats certain foods.  Continues to have mostly loose stools, occasional formed stool.  Only occurs after meals.  Salad seems to be the biggest trigger for his upset stomach and diarrhea.  No abdominal pain.  No nausea vomiting.  No mid back upper back pain.  Objective:   BP 124/88   Temp 98.3 F (36.8 C) (Oral)   Ht 5\' 10"  (1.778 m)   Wt (!) 311 lb (141.1 kg)   BMI 44.62 kg/m  NAD.  Alert, oriented.  Lungs clear.  Heart regular rate rhythm.  Abdomen soft nondistended nontender. Results for orders placed or performed in visit on 09/11/17  CBC with Differential  Result Value Ref Range   WBC 8.3 3.4 - 10.8 x10E3/uL   RBC 4.64 4.14 - 5.80 x10E6/uL   Hemoglobin 14.1 13.0 - 17.7 g/dL   Hematocrit 16.141.1 09.637.5 - 51.0 %   MCV 89 79 - 97 fL   MCH 30.4 26.6 - 33.0 pg   MCHC 34.3 31.5 - 35.7 g/dL   RDW 04.513.9 40.912.3 - 81.115.4 %   Platelets 310 150 - 450 x10E3/uL   Neutrophils 55 Not Estab. %   Lymphs 33 Not Estab. %   Monocytes 9 Not Estab. %   Eos 2 Not Estab. %   Basos 1 Not Estab. %   Neutrophils Absolute 4.6 1.4 - 7.0 x10E3/uL   Lymphocytes Absolute 2.8 0.7 - 3.1 x10E3/uL   Monocytes Absolute 0.8 0.1 - 0.9 x10E3/uL   EOS (ABSOLUTE) 0.2 0.0 - 0.4 x10E3/uL   Basophils Absolute 0.1 0.0 - 0.2 x10E3/uL   Immature Granulocytes 0 Not Estab. %   Immature Grans (Abs) 0.0 0.0 - 0.1 x10E3/uL  Basic Metabolic Panel (BMET)  Result Value Ref Range   Glucose 74 65 - 99 mg/dL   BUN 13 6 - 20 mg/dL   Creatinine, Ser 9.141.03 0.76 - 1.27 mg/dL   GFR calc non Af Amer 98 >59 mL/min/1.73   GFR calc Af Amer 113 >59 mL/min/1.73   BUN/Creatinine Ratio 13 9 - 20   Sodium 143 134 - 144 mmol/L   Potassium 4.3 3.5 - 5.2 mmol/L   Chloride 102 96 - 106 mmol/L   CO2 24 20 - 29 mmol/L   Calcium 10.2 8.7 - 10.2 mg/dL  Lipase  Result Value Ref Range   Lipase 49 13 - 78 U/L  Hepatic  function panel  Result Value Ref Range   Total Protein 7.3 6.0 - 8.5 g/dL   Albumin 5.0 3.5 - 5.5 g/dL   Bilirubin Total 1.0 0.0 - 1.2 mg/dL   Bilirubin, Direct 7.820.20 0.00 - 0.40 mg/dL   Alkaline Phosphatase 68 39 - 117 IU/L   AST 22 0 - 40 IU/L   ALT 25 0 - 44 IU/L   Reviewed lab and ultrasound results with patient.  Encourage low-fat and low cholesterol diet as well as weight loss.  Assessment:   Problem List Items Addressed This Visit      Digestive   Gastroesophageal reflux disease without esophagitis - Primary       Plan: Recommend daily OTC bowel probiotic.  Warning signs reviewed including fever, vomiting, abdominal pain.  Use Pepcid and Carafate as needed acid reflux.  Recheck here as needed.

## 2017-10-02 NOTE — Patient Instructions (Signed)
You can get probiotics at [facility] [employer] bulk packages.

## 2018-01-11 ENCOUNTER — Encounter: Payer: Self-pay | Admitting: Family Medicine

## 2018-01-11 ENCOUNTER — Ambulatory Visit (INDEPENDENT_AMBULATORY_CARE_PROVIDER_SITE_OTHER): Payer: Commercial Managed Care - PPO | Admitting: Family Medicine

## 2018-01-11 VITALS — BP 124/78 | Temp 99.5°F | Ht 70.0 in | Wt 322.0 lb

## 2018-01-11 DIAGNOSIS — J029 Acute pharyngitis, unspecified: Secondary | ICD-10-CM

## 2018-01-11 DIAGNOSIS — J020 Streptococcal pharyngitis: Secondary | ICD-10-CM

## 2018-01-11 LAB — POCT RAPID STREP A (OFFICE): Rapid Strep A Screen: NEGATIVE

## 2018-01-11 MED ORDER — AMOXICILLIN 500 MG PO TABS: 500.0000 mg | ORAL_TABLET | Freq: Three times a day (TID) | ORAL | 0 refills | Status: DC

## 2018-01-11 NOTE — Progress Notes (Signed)
   Subjective:    Patient ID: Marvin LagerJeffrey N Rivera, male    DOB: [birth year], 27 y.o.   MRN: [id number]  HPI  Patient is here today with fever,body aches,chills,sore throat,headache. This started on Saturday. He has been taking tylenol. Significant headache sore throat not feeling good fever chills body aches denies wheezing vomiting diarrhea PMH benign Review of Systems  Constitutional: Negative for activity change, chills and fever.  HENT: Negative for congestion, ear pain and rhinorrhea.   Eyes: Negative for discharge.  Respiratory: Negative for cough and wheezing.   Cardiovascular: Negative for chest pain.  Gastrointestinal: Negative for nausea and vomiting.  Musculoskeletal: Negative for arthralgias.       Results for orders placed or performed in visit on 01/11/18  POCT rapid strep A  Result Value Ref Range   Rapid Strep A Screen Negative Negative    Objective:   Physical Exam  Constitutional: He appears well-developed.  HENT:  Head: Normocephalic and atraumatic.  Mouth/Throat: Oropharyngeal exudate present.  Neck: Normal range of motion.  Cardiovascular: Normal rate, regular rhythm and normal heart sounds.  No murmur heard. Pulmonary/Chest: Effort normal and breath sounds normal. He has no wheezes.  Lymphadenopathy:    He has no cervical adenopathy.  Neurological: He exhibits normal muscle tone.  Skin: Skin is warm and dry.  Nursing note and vitals reviewed.         Assessment & Plan:  Probable strep Antibiotic prescribed Follow-up if ongoing trouble Warning signs were discussed in detail

## 2018-01-12 LAB — SPECIMEN STATUS REPORT

## 2018-01-12 LAB — STREP A DNA PROBE: STREP GP A DIRECT, DNA PROBE: NEGATIVE

## 2018-07-15 ENCOUNTER — Ambulatory Visit (INDEPENDENT_AMBULATORY_CARE_PROVIDER_SITE_OTHER): Payer: Commercial Managed Care - PPO | Admitting: *Deleted

## 2018-07-15 ENCOUNTER — Other Ambulatory Visit: Payer: Self-pay

## 2018-07-15 DIAGNOSIS — Z23 Encounter for immunization: Secondary | ICD-10-CM | POA: Diagnosis not present

## 2018-07-20 ENCOUNTER — Ambulatory Visit: Payer: Commercial Managed Care - PPO

## 2018-08-09 ENCOUNTER — Telehealth: Payer: Self-pay | Admitting: Family Medicine

## 2018-08-09 NOTE — Telephone Encounter (Signed)
Pt wife contacted office to make appt for newborn son. Pt wife states that patient has been really depressed since his mom passed away last week. Wife wanting to know if Dr.Scott would speak with her at newborn appt today at 1 about husband. (Husband will be driving wife and newborn son to appt)Please advise. Thank you.

## 2018-08-09 NOTE — Telephone Encounter (Signed)
I can touch base with the wife at the time they come regarding the patient But I would also highly recommend a formal office visit if they are concerned about this This can be done in person or video/audio

## 2018-08-09 NOTE — Telephone Encounter (Signed)
Pt wife contacted and verbalized understanding. Pt wife is going to call insurance to see if grief counseling is covered. Pt wife will let us know if pt needs referral.

## 2018-09-27 ENCOUNTER — Telehealth: Payer: Self-pay | Admitting: Family Medicine

## 2018-09-27 DIAGNOSIS — Z3009 Encounter for other general counseling and advice on contraception: Secondary | ICD-10-CM

## 2018-09-27 NOTE — Telephone Encounter (Signed)
Please give referral to [facility]

## 2018-09-27 NOTE — Telephone Encounter (Signed)
Referral ordered in Epic. 

## 2018-09-27 NOTE — Telephone Encounter (Signed)
Pt would like a referral to have a vasectomy

## 2018-10-15 ENCOUNTER — Encounter: Payer: Self-pay | Admitting: Family Medicine

## 2018-12-20 ENCOUNTER — Other Ambulatory Visit: Payer: Self-pay

## 2018-12-20 DIAGNOSIS — Z20822 Contact with and (suspected) exposure to covid-19: Secondary | ICD-10-CM

## 2018-12-21 LAB — NOVEL CORONAVIRUS, NAA: SARS-CoV-2, NAA: NOT DETECTED

## 2019-02-15 ENCOUNTER — Other Ambulatory Visit: Payer: Self-pay

## 2019-02-15 DIAGNOSIS — Z20822 Contact with and (suspected) exposure to covid-19: Secondary | ICD-10-CM

## 2019-02-16 LAB — NOVEL CORONAVIRUS, NAA: SARS-CoV-2, NAA: NOT DETECTED

## 2019-02-18 ENCOUNTER — Telehealth: Payer: Self-pay | Admitting: Family Medicine

## 2019-02-18 NOTE — Telephone Encounter (Signed)
Patient called and received his covid test result °

## 2020-05-15 ENCOUNTER — Ambulatory Visit (HOSPITAL_COMMUNITY)
Admission: RE | Admit: 2020-05-15 | Discharge: 2020-05-15 | Disposition: A | Discharge status: Home / Self Care | Payer: Self-pay | Source: Ambulatory Visit | Attending: Family Medicine | Admitting: Family Medicine

## 2020-05-15 ENCOUNTER — Encounter: Payer: Self-pay | Admitting: Family Medicine

## 2020-05-15 ENCOUNTER — Other Ambulatory Visit: Payer: Self-pay

## 2020-05-15 ENCOUNTER — Ambulatory Visit (INDEPENDENT_AMBULATORY_CARE_PROVIDER_SITE_OTHER): Payer: Commercial Managed Care - PPO | Admitting: Family Medicine

## 2020-05-15 VITALS — HR 103 | Temp 98.5°F

## 2020-05-15 DIAGNOSIS — R0602 Shortness of breath: Secondary | ICD-10-CM | POA: Insufficient documentation

## 2020-05-15 NOTE — Progress Notes (Signed)
   Subjective:    Patient ID: Marvin Rivera, male    DOB: 1991/03/31, 30 y.o.   MRN: [id number]  HPIpt is positive for covid 19. Test came back positive one week ago per pt. About 2 days ago started having sob. Pt worried about pneumonia.   Patient tested positive last week he finds himself feeling very fatigued and rundown he also finds himself running some off-and-on fevers initially but no longer running a fever.  Review of Systems Patient is with fatigue tiredness cough runny nose some shortness of breath when moving around    Objective:    Physical Exam  O2 saturation 98 Lungs respiratory rate is normal no crackles no rails heart regular      Assessment & Plan:  COVID infection Chest x-ray ordered Await the results  Chest x-ray results came back negative.  No pneumonia.  Warning signs were discussed with the patient he will follow-up if ongoing troubles Follow-up if progressive troubles or worse

## 2020-10-06 ENCOUNTER — Observation Stay (HOSPITAL_COMMUNITY): Payer: Commercial Managed Care - PPO

## 2020-10-06 ENCOUNTER — Encounter: Payer: Self-pay | Admitting: Family Medicine

## 2020-10-06 ENCOUNTER — Observation Stay (HOSPITAL_BASED_OUTPATIENT_CLINIC_OR_DEPARTMENT_OTHER): Payer: Commercial Managed Care - PPO

## 2020-10-06 ENCOUNTER — Emergency Department (HOSPITAL_COMMUNITY): Payer: Commercial Managed Care - PPO

## 2020-10-06 ENCOUNTER — Observation Stay (HOSPITAL_COMMUNITY)
Admission: EM | Admit: 2020-10-06 | Discharge: 2020-10-06 | Disposition: A | Discharge status: Home / Self Care | Payer: Commercial Managed Care - PPO | Attending: Family Medicine | Admitting: Family Medicine

## 2020-10-06 ENCOUNTER — Encounter (HOSPITAL_COMMUNITY): Payer: Self-pay | Admitting: Emergency Medicine

## 2020-10-06 ENCOUNTER — Other Ambulatory Visit: Payer: Self-pay

## 2020-10-06 DIAGNOSIS — Z20822 Contact with and (suspected) exposure to covid-19: Secondary | ICD-10-CM | POA: Diagnosis not present

## 2020-10-06 DIAGNOSIS — R0789 Other chest pain: Secondary | ICD-10-CM | POA: Diagnosis present

## 2020-10-06 DIAGNOSIS — K219 Gastro-esophageal reflux disease without esophagitis: Secondary | ICD-10-CM | POA: Diagnosis present

## 2020-10-06 DIAGNOSIS — I10 Essential (primary) hypertension: Secondary | ICD-10-CM | POA: Insufficient documentation

## 2020-10-06 DIAGNOSIS — R079 Chest pain, unspecified: Secondary | ICD-10-CM

## 2020-10-06 DIAGNOSIS — K8010 Calculus of gallbladder with chronic cholecystitis without obstruction: Secondary | ICD-10-CM | POA: Diagnosis not present

## 2020-10-06 DIAGNOSIS — R739 Hyperglycemia, unspecified: Secondary | ICD-10-CM | POA: Diagnosis present

## 2020-10-06 DIAGNOSIS — K8020 Calculus of gallbladder without cholecystitis without obstruction: Secondary | ICD-10-CM

## 2020-10-06 DIAGNOSIS — I309 Acute pericarditis, unspecified: Secondary | ICD-10-CM

## 2020-10-06 LAB — ECHOCARDIOGRAM COMPLETE
Area-P 1/2: 4.06 cm2
Height: 70 in
S' Lateral: 3 cm
Single Plane A4C EF: 68.7 %
Weight: 5280 oz

## 2020-10-06 LAB — BASIC METABOLIC PANEL
Anion gap: 6 (ref 5–15)
BUN: 14 mg/dL (ref 6–20)
CO2: 29 mmol/L (ref 22–32)
Calcium: 9.2 mg/dL (ref 8.9–10.3)
Chloride: 103 mmol/L (ref 98–111)
Creatinine, Ser: 0.97 mg/dL (ref 0.61–1.24)
GFR, Estimated: >60 mL/min (ref >60)
Glucose, Bld: 104 mg/dL — ABNORMAL HIGH (ref 70–99)
Potassium: 4 mmol/L (ref 3.5–5.1)
Sodium: 138 mmol/L (ref 135–145)

## 2020-10-06 LAB — TROPONIN I (HIGH SENSITIVITY)
Troponin I (High Sensitivity): 4 ng/L (ref <18)
Troponin I (High Sensitivity): 4 ng/L (ref <18)

## 2020-10-06 LAB — C-REACTIVE PROTEIN: CRP: 0.9 mg/dL (ref <1.0)

## 2020-10-06 LAB — CBC
HCT: 41.1 % (ref 39.0–52.0)
Hemoglobin: 13.6 g/dL (ref 13.0–17.0)
MCH: 30.2 pg (ref 26.0–34.0)
MCHC: 33.1 g/dL (ref 30.0–36.0)
MCV: 91.3 fL (ref 80.0–100.0)
Platelets: 315 10*3/uL (ref 150–400)
RBC: 4.5 MIL/uL (ref 4.22–5.81)
RDW: 13.2 % (ref 11.5–15.5)
WBC: 9.7 10*3/uL (ref 4.0–10.5)
nRBC: 0 % (ref 0.0–0.2)

## 2020-10-06 LAB — RESP PANEL BY RT-PCR (FLU A&B, COVID) ARPGX2
Influenza A by PCR: NEGATIVE
Influenza B by PCR: NEGATIVE
SARS Coronavirus 2 by RT PCR: NEGATIVE

## 2020-10-06 LAB — SEDIMENTATION RATE: Sed Rate: 8 mm/hr (ref 0–16)

## 2020-10-06 LAB — D-DIMER, QUANTITATIVE: D-Dimer, Quant: 0.89 ug/mL-FEU — ABNORMAL HIGH (ref 0.00–0.50)

## 2020-10-06 LAB — HIV ANTIBODY (ROUTINE TESTING W REFLEX): HIV Screen 4th Generation wRfx: NONREACTIVE

## 2020-10-06 MED ORDER — ONDANSETRON HCL 4 MG/2ML IJ SOLN
4.0000 mg | Freq: Four times a day (QID) | INTRAMUSCULAR | Status: DC | PRN
Start: 2020-10-06 — End: 2020-10-06

## 2020-10-06 MED ORDER — KETOROLAC TROMETHAMINE 30 MG/ML IJ SOLN
30.0000 mg | Freq: Once | INTRAMUSCULAR | Status: AC
  Administered 2020-10-06: 30 mg via INTRAMUSCULAR
  Filled 2020-10-06: qty 1

## 2020-10-06 MED ORDER — IOHEXOL 350 MG/ML SOLN
100.0000 mL | Freq: Once | INTRAVENOUS | Status: AC | PRN
  Administered 2020-10-06: 100 mL via INTRAVENOUS

## 2020-10-06 MED ORDER — ENOXAPARIN SODIUM 40 MG/0.4ML IJ SOSY
40.0000 mg | PREFILLED_SYRINGE | INTRAMUSCULAR | Status: DC
  Filled 2020-10-06: qty 0.4

## 2020-10-06 MED ORDER — LISINOPRIL 10 MG PO TABS
10.0000 mg | ORAL_TABLET | Freq: Every day | ORAL | Status: DC
  Administered 2020-10-06: 10 mg via ORAL
  Filled 2020-10-06: qty 1

## 2020-10-06 MED ORDER — ACETAMINOPHEN 325 MG PO TABS: 650.0000 mg | ORAL_TABLET | ORAL | Status: DC | PRN

## 2020-10-06 MED ORDER — OMEPRAZOLE MAGNESIUM 20 MG PO TBEC: 20.0000 mg | DELAYED_RELEASE_TABLET | Freq: Every day | ORAL | 1 refills | Status: DC

## 2020-10-06 MED ORDER — KETOROLAC TROMETHAMINE 30 MG/ML IJ SOLN: 30.0000 mg | Freq: Once | INTRAMUSCULAR | Status: DC

## 2020-10-06 MED ORDER — LIDOCAINE VISCOUS HCL 2 % MT SOLN
15.0000 mL | Freq: Once | OROMUCOSAL | Status: AC
  Administered 2020-10-06: 15 mL via ORAL
  Filled 2020-10-06: qty 15

## 2020-10-06 MED ORDER — LISINOPRIL 10 MG PO TABS: 10.0000 mg | ORAL_TABLET | Freq: Every day | ORAL | 2 refills | Status: DC

## 2020-10-06 MED ORDER — ENOXAPARIN SODIUM 40 MG/0.4ML IJ SOSY: 40.0000 mg | PREFILLED_SYRINGE | INTRAMUSCULAR | Status: DC

## 2020-10-06 MED ORDER — ALUM & MAG HYDROXIDE-SIMETH 200-200-20 MG/5ML PO SUSP
30.0000 mL | Freq: Once | ORAL | Status: AC
  Administered 2020-10-06: 30 mL via ORAL
  Filled 2020-10-06: qty 30

## 2020-10-06 NOTE — Discharge Summary (Signed)
Marvin Rivera, is a 30 y.o. male  DOB 1990-06-25  MRN [id number].  Admission date:  10/06/2020  Admitting Physician  Reubin Milan, MD  Discharge Date:  10/06/2020   Primary MD  Kathyrn Drown, MD  Recommendations for primary care physician for things to follow:   1)Follow up General Surgeon to discuss Possible Removal of your gallbladder  2)Morbid Obesity- -Low calorie diet, portion control and increase physical activity advised -Body mass index is 47.35 kg/m.   Admission Diagnosis  Gallstones [K80.20] Atypical chest pain [R07.89] Chest pain [R07.9] Acute pericarditis, unspecified type [I30.9]   Discharge Diagnosis  Gallstones [K80.20] Atypical chest pain [R07.89] Chest pain [R07.9] Acute pericarditis, unspecified type [I30.9]    Principal Problem:   Chronic cholecystitis with calculus Active Problems:   Gastroesophageal reflux disease without esophagitis   Hypertension   Class 3 obesity    Hyperglycemia      Past Medical History:  Diagnosis Date   Hypertension    Liver cyst 2009    History reviewed. No pertinent surgical history.   HPI  from the history and physical done on the day of admission:    Chief Complaint: Chest pain.   HPI: Marvin Rivera is a 30 y.o. male with medical history significant of hypertension, liver cyst, class III obesity, GERD who is coming to the emergency department due to midsternal sharp and outwards pressure-like chest pain radiated to his right shoulder associated with dyspnea.  The pain worsens with deep inspiration.  He denies lightheadedness, nausea, emesis, diaphoresis or palpitations that started around 2200 last night.  The patient states that earlier in the evening he felt some discomfort in his chest but then the pain worsened significantly when he lied down.  The patient also stated that every few weeks he feels like he is a skipping of  heartbeat followed by a few seconds of generalized pain.  He denied fever, chills, sore throat, rhinorrhea, productive cough, wheezing or hemoptysis.  No abdominal pain, diarrhea, constipation, melena or hematochezia.  Denied dysuria, frequency or hematuria.  No polyuria, polydipsia, polyphagia or blurred vision.   ED Course: Initial vital signs were temperature 98.5 F, pulse 82, respirations 17, BP 146/86 mmHg and O2 sat 98% on room air.  The patient was given Maalox with lidocaine and 30 mg of ketorolac IM x1.   Lab work: CBC was normal.  D-dimer was 0.89 mcg/mL.  Troponin was 4 ng/L x 2 BMP showed a glucose of 104 mg/dL but was otherwise unremarkable.   Imaging: A portable chest radiograph did not show any acute cardiopulmonary abnormality.  CTA chest did not show any embolism or acute cardiopulmonary pathology.  The gallbladder was contracted with small gallstones.  Please see images and phonated report for further detail.    Hospital Course:     A/p 1) Chest Pain Echo with preserved EF of 60 to 65 %, w/o pericardial effusion -No wall motion abnormalities -Troponin 4, repeat troponin 4 -On Telemetry monitored unit no arrythmia- ESR is  8, CRP 0.9  -D-Dimer 0.89,  CTA of the chest w/o PE, or other acute cardiopulmonary findings -Clinically No evidence of pericarditis -RUQ Ultrasound with cholelithiasis, needs surgical  - 2)HTN-- c/n Lisinopril   3) Class 3 obesity Has lost 40 pounds in the last few months. Continue lifestyle modifications.   4)GERD- c/n PPI  5)Hyperglycemia This was a nonfasting sample--- -Continue weight loss and lifestyle modifications. Follow-up with PCP with recheck and glucose and A1c  6)Gallstones -RUQ ultrasound -Contracted gallbladder with stones within. Presuming the patient is NPO, this gallbladder contraction suggests a component of chronic Cholecystitis Gen surgery consult noted, he recommends outpatient lap cholecystectomy  Discharge  Condition: stable  Follow UP   Follow-up Information     Aviva Signs, MD. Schedule an appointment as soon as possible for a visit in 1 week(s).   Specialty: General Surgery Why: for possible gallbladder removal Contact information: [address] [place] [address] [phone]                 Consults obtained - Gen surgeon  Diet and Activity recommendation:  As advised  Discharge Instructions    Discharge Instructions     Call MD for:  difficulty breathing, headache or visual disturbances   Complete by: As directed    Call MD for:  persistant dizziness or light-headedness   Complete by: As directed    Call MD for:  persistant nausea and vomiting   Complete by: As directed    Call MD for:  severe uncontrolled pain   Complete by: As directed    Call MD for:  temperature >100.4   Complete by: As directed    Diet - low sodium heart healthy   Complete by: As directed    Discharge instructions   Complete by: As directed    1)Follow up General Surgeon to discuss Possible Removal of your gallbladder  2)Morbid Obesity- -Low calorie diet, portion control and increase physical activity advised -Body mass index is 47.35 kg/m.   Increase activity slowly   Complete by: As directed        Discharge Medications     Allergies as of 10/06/2020       Reactions   Bee Venom    yellowjackets        Medication List     STOP taking these medications    naproxen sodium 220 MG tablet Commonly known as: ALEVE       TAKE these medications    lisinopril 10 MG tablet Commonly known as: ZESTRIL Take 1 tablet (10 mg total) by mouth daily. For BP Start taking on: October 07, 2020   omeprazole 20 MG tablet Commonly known as: PriLOSEC OTC Take 1 tablet (20 mg total) by mouth daily.       Major procedures and Radiology Reports - PLEASE review detailed and final reports for all details, in brief -   CT Angio Chest PE W and/or Wo Contrast  Result  Date: 10/06/2020 CLINICAL DATA:  Elevated D-dimer, chest pain EXAM: CT ANGIOGRAPHY CHEST WITH CONTRAST TECHNIQUE: Multidetector CT imaging of the chest was performed using the standard protocol during bolus administration of intravenous contrast. Multiplanar CT image reconstructions and MIPs were obtained to evaluate the vascular anatomy. CONTRAST:  17m OMNIPAQUE IOHEXOL 350 MG/ML SOLN COMPARISON:  None. FINDINGS: Cardiovascular: No filling defects in the pulmonary arteries to suggest pulmonary emboli. Heart is normal size. Aorta is normal caliber. Mediastinum/Nodes: No mediastinal, hilar, or axillary adenopathy. Trachea and esophagus are unremarkable.  Thyroid unremarkable. Lungs/Pleura: Lungs are clear. No focal airspace opacities or suspicious nodules. No effusions. Upper Abdomen: Diffuse fatty infiltration of the liver. Gallbladder is contracted with small stones. Musculoskeletal: Chest wall soft tissues are unremarkable. No acute bony abnormality. Review of the MIP images confirms the above findings. IMPRESSION: No evidence of pulmonary embolus. No acute cardiopulmonary disease. Contracted gallbladder with small stones. Electronically Signed   By: Rolm Baptise M.D.   On: 10/06/2020 03:38   DG Chest Portable 1 View  Result Date: 10/06/2020 CLINICAL DATA:  Midsternal chest pain with began at 8 p.m. last night, worse with lying down or bending over EXAM: PORTABLE CHEST 1 VIEW COMPARISON:  05/15/2020 FINDINGS: No consolidation, features of edema, pneumothorax, or effusion. Pulmonary vascularity is normally distributed. The cardiomediastinal contours are unremarkable. No acute osseous or soft tissue abnormality. Telemetry leads overlie the chest. IMPRESSION: No acute cardiopulmonary abnormality. Electronically Signed   By: Lovena Le M.D.   On: 10/06/2020 02:10   ECHOCARDIOGRAM COMPLETE  Result Date: 10/06/2020    ECHOCARDIOGRAM REPORT   Patient Name:   Marvin Rivera Date of Exam: 10/06/2020 Medical Rec  #:  [id number]       Height:       70.0 in Accession #:    [id number]      Weight:       330.0 lb Date of Birth:  05-14-1990       BSA:          2.582 m Patient Age:    43 years        BP:           127/71 mmHg Patient Gender: M               HR:           71 bpm. Exam Location:  [facility] Procedure: 2D Echo, Cardiac Doppler and Color Doppler Indications:    Chest pain  History:        Patient has no prior history of Echocardiogram examinations.                 Signs/Symptoms:Chest Pain and Dyspnea; Risk Factors:Hypertension                 and Obesity.  Sonographer:    Dustin Flock RDCS Referring Phys: [id number] [place]  1. Left ventricular ejection fraction, by estimation, is 60 to 65%. The left ventricle has normal function. The left ventricle has no regional wall motion abnormalities. There is mild concentric left ventricular hypertrophy. Left ventricular diastolic parameters are indeterminate.  2. Right ventricular systolic function is normal. The right ventricular size is mildly enlarged. There is normal pulmonary artery systolic pressure. The estimated right ventricular systolic pressure is 08.6 mmHg.  3. The mitral valve is normal in structure. Trivial mitral valve regurgitation. No evidence of mitral stenosis.  4. The aortic valve is normal in structure. Aortic valve regurgitation is not visualized. No aortic stenosis is present.  5. The inferior vena cava is normal in size with greater than 50% respiratory variability, suggesting right atrial pressure of 3 mmHg. FINDINGS  Left Ventricle: Left ventricular ejection fraction, by estimation, is 60 to 65%. The left ventricle has normal function. The left ventricle has no regional wall motion abnormalities. The left ventricular internal cavity size was normal in size. There is  mild concentric left ventricular hypertrophy. Left ventricular diastolic parameters are indeterminate. Normal left ventricular filling pressure. Right Ventricle:  The right  ventricular size is mildly enlarged. No increase in right ventricular wall thickness. Right ventricular systolic function is normal. There is normal pulmonary artery systolic pressure. The tricuspid regurgitant velocity is 2.26  m/s, and with an assumed right atrial pressure of 3 mmHg, the estimated right ventricular systolic pressure is 42.8 mmHg. Left Atrium: Left atrial size was normal in size. Right Atrium: Right atrial size was normal in size. Pericardium: There is no evidence of pericardial effusion. Mitral Valve: The mitral valve is normal in structure. Trivial mitral valve regurgitation. No evidence of mitral valve stenosis. Tricuspid Valve: The tricuspid valve is normal in structure. Tricuspid valve regurgitation is trivial. No evidence of tricuspid stenosis. Aortic Valve: The aortic valve is normal in structure. Aortic valve regurgitation is not visualized. No aortic stenosis is present. Pulmonic Valve: The pulmonic valve was normal in structure. Pulmonic valve regurgitation is trivial. No evidence of pulmonic stenosis. Aorta: The aortic root is normal in size and structure. Venous: The inferior vena cava is normal in size with greater than 50% respiratory variability, suggesting right atrial pressure of 3 mmHg. IAS/Shunts: No atrial level shunt detected by color flow Doppler.  LEFT VENTRICLE PLAX 2D LVIDd:         4.80 cm      Diastology LVIDs:         3.00 cm      LV e' medial:    6.05 cm/s LV PW:         1.36 cm      LV E/e' medial:  14.2 LV IVS:        1.37 cm      LV e' lateral:   15.00 cm/s LVOT diam:     2.30 cm      LV E/e' lateral: 5.7 LV SV:         98 LV SV Index:   38 LVOT Area:     4.15 cm  LV Volumes (MOD) LV vol d, MOD A4C: 153.0 ml LV vol s, MOD A4C: 47.9 ml LV SV MOD A4C:     153.0 ml RIGHT VENTRICLE RV Basal diam:  4.25 cm RV S prime:     12.40 cm/s TAPSE (M-mode): 3.3 cm LEFT ATRIUM             Index       RIGHT ATRIUM           Index LA diam:        3.90 cm 1.51 cm/m  RA  Area:     23.40 cm LA Vol (A2C):   67.5 ml 26.14 ml/m RA Volume:   75.90 ml  29.39 ml/m LA Vol (A4C):   39.5 ml 15.30 ml/m LA Biplane Vol: 52.4 ml 20.29 ml/m  AORTIC VALVE LVOT Vmax:   124.00 cm/s LVOT Vmean:  75.700 cm/s LVOT VTI:    0.236 m  AORTA Ao Root diam: 3.30 cm MITRAL VALVE               TRICUSPID VALVE MV Area (PHT): 4.06 cm    TR Peak grad:   20.4 mmHg MV Decel Time: 187 msec    TR Vmax:        226.00 cm/s MV E velocity: 85.70 cm/s MV A velocity: 56.00 cm/s  SHUNTS MV E/A ratio:  1.53        Systemic VTI:  0.24 m  Systemic Diam: 2.30 cm Fransico Him MD Electronically signed by Fransico Him MD Signature Date/Time: 10/06/2020/11:46:07 AM    Final    US Abdomen Limited RUQ (LIVER/GB)  Result Date: 10/06/2020 CLINICAL DATA:  Follow-up of abnormal CT. EXAM: ULTRASOUND ABDOMEN LIMITED RIGHT UPPER QUADRANT COMPARISON:  10/06/2020 CT FINDINGS: Gallbladder: Stones of up to 3 mm within a contracted gallbladder. No pericholecystic fluid. Sonographic Murphy's sign was not elicited. Common bile duct: Diameter: Normal, 4 mm. Liver: Increased hepatic echogenicity. Portal vein is patent on color Doppler imaging with normal direction of blood flow towards the liver. Other: None. IMPRESSION: 1. Contracted gallbladder with stones within. Presuming the patient is NPO, this gallbladder contraction suggests a component of chronic cholecystitis. No acute cholecystitis. 2. Hepatic steatosis. Electronically Signed   By: Abigail Miyamoto M.D.   On: 10/06/2020 11:47    Micro Results   Recent Results (from the past 240 hour(s))  Resp Panel by RT-PCR (Flu A&B, Covid) Nasopharyngeal Swab     Status: None   Collection Time: 10/06/20  4:28 AM   Specimen: Nasopharyngeal Swab; Nasopharyngeal(NP) swabs in vial transport medium  Result Value Ref Range Status   SARS Coronavirus 2 by RT PCR NEGATIVE NEGATIVE Final    Comment: (NOTE) SARS-CoV-2 target nucleic acids are NOT DETECTED.  The SARS-CoV-2  RNA is generally detectable in upper respiratory specimens during the acute phase of infection. The lowest concentration of SARS-CoV-2 viral copies this assay can detect is 138 copies/mL. A negative result does not preclude SARS-Cov-2 infection and should not be used as the sole basis for treatment or other patient management decisions. A negative result may occur with  improper specimen collection/handling, submission of specimen other than nasopharyngeal swab, presence of viral mutation(s) within the areas targeted by this assay, and inadequate number of viral copies(<138 copies/mL). A negative result must be combined with clinical observations, patient history, and epidemiological information. The expected result is Negative.  Fact Sheet for Patients:  EntrepreneurPulse.com.au  Fact Sheet for Healthcare Providers:  IncredibleEmployment.be  This test is no t yet approved or cleared by the [place] FDA and  has been authorized for detection and/or diagnosis of SARS-CoV-2 by FDA under an Emergency Use Authorization (EUA). This EUA will remain  in effect (meaning this test can be used) for the duration of the COVID-19 declaration under Section 564(b)(1) of the Act, 21 U.S.C.section 360bbb-3(b)(1), unless the authorization is terminated  or revoked sooner.       Influenza A by PCR NEGATIVE NEGATIVE Final   Influenza B by PCR NEGATIVE NEGATIVE Final    Comment: (NOTE) The Xpert Xpress SARS-CoV-2/FLU/RSV plus assay is intended as an aid in the diagnosis of influenza from Nasopharyngeal swab specimens and should not be used as a sole basis for treatment. Nasal washings and aspirates are unacceptable for Xpert Xpress SARS-CoV-2/FLU/RSV testing.  Fact Sheet for Patients: EntrepreneurPulse.com.au  Fact Sheet for Healthcare Providers: IncredibleEmployment.be  This test is not yet approved or cleared by the  [place] FDA and has been authorized for detection and/or diagnosis of SARS-CoV-2 by FDA under an Emergency Use Authorization (EUA). This EUA will remain in effect (meaning this test can be used) for the duration of the COVID-19 declaration under Section 564(b)(1) of the Act, 21 U.S.C. section 360bbb-3(b)(1), unless the authorization is terminated or revoked.  Performed at [facility], [address], [place], [place] [address]    Today  Subjective   Marvin Rivera today has no new complaints  No fever  Or chills  No Nausea, Vomiting or Diarrhea       Patient has been seen and examined prior to discharge   Objective   Blood pressure 130/68, pulse 75, temperature 98.2 F (36.8 C), temperature source Oral, resp. rate 16, height _0  (1.778 m), weight (!) 149.7 kg, SpO2 98 %.  No intake or output data in the 24 hours ending 10/06/20 1702  Exam Gen:- Awake Alert, no acute distress  HEENT:- [place].AT, No sclera icterus Neck-Supple Neck,No JVD,.  Lungs-  CTAB , good air movement bilaterally  CV- S1, S2 normal, regular Abd-  +ve B.Sounds, Abd Soft, RUQ tenderness w/o rebound, or gaurding    Extremity/Skin:- No  edema,   good pulses Psych-affect is appropriate, oriented x3 Neuro-no new focal deficits, no tremors    Data Review   CBC w Diff:  Lab Results  Component Value Date   WBC 9.7 10/06/2020   HGB 13.6 10/06/2020   HGB 14.1 10/01/2017   HCT 41.1 10/06/2020   HCT 41.1 10/01/2017   PLT 315 10/06/2020   PLT 310 10/01/2017   LYMPHOPCT 24 09/03/2017   MONOPCT 6 09/03/2017   EOSPCT 1 09/03/2017   BASOPCT 0 09/03/2017   CMP:  Lab Results  Component Value Date   NA 138 10/06/2020   NA 143 10/01/2017   K 4.0 10/06/2020   CL 103 10/06/2020   CO2 29 10/06/2020   BUN 14 10/06/2020   BUN 13 10/01/2017   CREATININE 0.97 10/06/2020   PROT 7.3 10/01/2017   ALBUMIN 5.0 10/01/2017   BILITOT 1.0 10/01/2017   ALKPHOS 68 10/01/2017   AST 22 10/01/2017   ALT 25  10/01/2017   Total Discharge time is about 33 minutes  Roxan Hockey M.D on 10/06/2020 at 5:02 PM  Go to [email or web address] -  for contact info  [facility] - Office  [phone]

## 2020-10-06 NOTE — ED Notes (Signed)
Patient resting in bed with eyes closed. Respirations even and unlabored. Equal chest rise and fall. NAD noted

## 2020-10-06 NOTE — ED Notes (Signed)
Patient transported to CT 

## 2020-10-06 NOTE — Progress Notes (Signed)
  Echocardiogram 2D Echocardiogram has been performed.  Pieter Partridge 10/06/2020, 10:51 AM

## 2020-10-06 NOTE — Plan of Care (Signed)

## 2020-10-06 NOTE — ED Triage Notes (Signed)
Pt with mid-sternal chest pain that started around 8pm last night. Pt states it hurts worse when he lies down or bends over. Pt describes pain as pressure and states that it radiates to R shoulder.

## 2020-10-06 NOTE — Discharge Instructions (Signed)
1)Follow up General Surgeon to discuss Possible Removal of your gallbladder  2)Morbid Obesity- -Low calorie diet, portion control and increase physical activity advised -Body mass index is 47.35 kg/m.

## 2020-10-06 NOTE — ED Provider Notes (Signed)
[facility] EMERGENCY DEPARTMENT Provider Note   CSN: [id number] Arrival date & time: 10/06/20  0106     History Chief Complaint  Patient presents with   Chest Pain    Marvin Rivera is a 30 y.o. male.  Patient with a history of obesity and hypertension presenting with chest pain and discomfort that started around 8 PM.  He describes pressure and numbness of the center of his chest that is fairly constant but waxes and wanes in severity.  It is worse when he lies down and worse when he bends over.  Some associated shortness of breath but he is not certain if this is attributed to his weight.  Denies any nausea or vomiting.  Denies any diaphoresis.  Denies any cough or fever.  No history of heart issues. Pain came on while he was resting and feels like a pressure and burning and numbness to the center of his chest. It is not gone away at all, waxes and wanes in severity depending on his position.  No radiation of the pain to his arms, neck or back.  No abdominal pain or low back pain. Does not smoke.  Never had a stress test. No recent, cough, fever or URI illness  The history is provided by the patient.  Chest Pain Associated symptoms: shortness of breath   Associated symptoms: no abdominal pain, no dizziness, no fatigue, no fever, no headache, no nausea, no vomiting and no weakness       Past Medical History:  Diagnosis Date   Hypertension    Liver cyst 2009    Patient Active Problem List   Diagnosis Date Noted   Gastroesophageal reflux disease without esophagitis 09/13/2017    History reviewed. No pertinent surgical history.     Family History  Problem Relation Age of Onset   Lung cancer Mother    Atrial fibrillation Mother    Hypertension Mother    Atrial fibrillation Father    Heart disease Paternal Grandmother    Other Paternal Grandmother        carney complex   Lung cancer Paternal Grandfather     Social History   Tobacco Use   Smoking status: Never    Smokeless tobacco: Never  Substance Use Topics   Alcohol use: Yes    Comment: rarely   Drug use: No    Home Medications Prior to Admission medications   Not on File    Allergies    Bee venom  Review of Systems   Review of Systems  Constitutional:  Negative for activity change, appetite change, fatigue and fever.  HENT:  Negative for congestion and rhinorrhea.   Eyes:  Negative for visual disturbance.  Respiratory:  Positive for chest tightness and shortness of breath.   Cardiovascular:  Positive for chest pain.  Gastrointestinal:  Negative for abdominal pain, nausea and vomiting.  Genitourinary:  Negative for dysuria and hematuria.  Musculoskeletal:  Negative for arthralgias and myalgias.  Skin:  Negative for rash.  Neurological:  Negative for dizziness, weakness and headaches.   all other systems are negative except as noted in the HPI and PMH.   Physical Exam Updated Vital Signs BP (!) 146/86 (BP Location: Right Arm)   Pulse 82   Temp 98.5 F (36.9 C) (Oral)   Resp 17   Ht 5\' 10"  (1.778 m)   Wt (!) 149.7 kg   SpO2 98%   BMI 47.35 kg/m   Physical Exam Vitals and nursing note reviewed.  Constitutional:      General: He is not in acute distress.    Appearance: He is well-developed.  HENT:     Head: Normocephalic and atraumatic.     Mouth/Throat:     Pharynx: No oropharyngeal exudate.  Eyes:     Conjunctiva/sclera: Conjunctivae normal.     Pupils: Pupils are equal, round, and reactive to light.  Neck:     Comments: No meningismus. Cardiovascular:     Rate and Rhythm: Normal rate and regular rhythm.     Heart sounds: Normal heart sounds. No murmur heard. Pulmonary:     Effort: Pulmonary effort is normal. No respiratory distress.     Breath sounds: Normal breath sounds.  Chest:     Chest wall: No tenderness.  Abdominal:     Palpations: Abdomen is soft.     Tenderness: There is no abdominal tenderness. There is no guarding or rebound.  Musculoskeletal:         General: No tenderness. Normal range of motion.     Cervical back: Normal range of motion and neck supple.  Skin:    General: Skin is warm.  Neurological:     Mental Status: He is alert and oriented to person, place, and time.     Cranial Nerves: No cranial nerve deficit.     Motor: No abnormal muscle tone.     Coordination: Coordination normal.     Comments:  5/5 strength throughout. CN 2-12 intact.Equal grip strength.   Psychiatric:        Behavior: Behavior normal.    ED Results / Procedures / Treatments   Labs (all labs ordered are listed, but only abnormal results are displayed) Labs Reviewed  BASIC METABOLIC PANEL - Abnormal; Notable for the following components:      Result Value   Glucose, Bld 104 (*)    All other components within normal limits  D-DIMER, QUANTITATIVE - Abnormal; Notable for the following components:   D-Dimer, Quant 0.89 (*)    All other components within normal limits  CBC  TROPONIN I (HIGH SENSITIVITY)  TROPONIN I (HIGH SENSITIVITY)    EKG EKG Interpretation  Date/Time:  Saturday October 06 2020 01:21:53 EDT Ventricular Rate:  74 PR Interval:  145 QRS Duration: 93 QT Interval:  369 QTC Calculation: 410 R Axis:   66 Text Interpretation: Sinus rhythm Borderline T wave abnormalities Baseline wander in lead(s) V2 T waves now upright laterally Confirmed by Glynn Octave [id number]) on 10/06/2020 1:31:29 AM  Radiology CT Angio Chest PE W and/or Wo Contrast  Result Date: 10/06/2020 CLINICAL DATA:  Elevated D-dimer, chest pain EXAM: CT ANGIOGRAPHY CHEST WITH CONTRAST TECHNIQUE: Multidetector CT imaging of the chest was performed using the standard protocol during bolus administration of intravenous contrast. Multiplanar CT image reconstructions and MIPs were obtained to evaluate the vascular anatomy. CONTRAST:  OMNIPAQUE IOHEXOL 350 MG/ML SOLN COMPARISON:  None. FINDINGS: Cardiovascular: No filling defects in the pulmonary arteries to suggest  pulmonary emboli. Heart is normal size. Aorta is normal caliber. Mediastinum/Nodes: No mediastinal, hilar, or axillary adenopathy. Trachea and esophagus are unremarkable. Thyroid unremarkable. Lungs/Pleura: Lungs are clear. No focal airspace opacities or suspicious nodules. No effusions. Upper Abdomen: Diffuse fatty infiltration of the liver. Gallbladder is contracted with small stones. Musculoskeletal: Chest wall soft tissues are unremarkable. No acute bony abnormality. Review of the MIP images confirms the above findings. IMPRESSION: No evidence of pulmonary embolus. No acute cardiopulmonary disease. Contracted gallbladder with small stones. Electronically Signed   By:  Charlett Nose M.D.   On: 10/06/2020 03:38   DG Chest Portable 1 View  Result Date: 10/06/2020 CLINICAL DATA:  Midsternal chest pain with began at 8 p.m. last night, worse with lying down or bending over EXAM: PORTABLE CHEST 1 VIEW COMPARISON:  05/15/2020 FINDINGS: No consolidation, features of edema, pneumothorax, or effusion. Pulmonary vascularity is normally distributed. The cardiomediastinal contours are unremarkable. No acute osseous or soft tissue abnormality. Telemetry leads overlie the chest. IMPRESSION: No acute cardiopulmonary abnormality. Electronically Signed   By: Kreg Shropshire M.D.   On: 10/06/2020 02:10    Procedures Procedures   Medications Ordered in ED Medications  alum & mag hydroxide-simeth (MAALOX/MYLANTA) 200-200-20 MG/5ML suspension 30 mL (has no administration in time range)    And  lidocaine (XYLOCAINE) 2 % viscous mouth solution 15 mL (has no administration in time range)  ketorolac (TORADOL) 30 MG/ML injection 30 mg (has no administration in time range)    ED Course  I have reviewed the triage vital signs and the nursing notes.  Pertinent labs & imaging results that were available during my care of the patient were reviewed by me and considered in my medical decision making (see chart for details).     MDM Rules/Calculators/A&P                         Chest discomfort since 8 PM that is fairly constant and waxes and wanes in severity depending on his position.  EKG shows Q waves that are improved from previous. Pain appears atypical for ACS.  Consider pulmonary embolism.  Consider pericarditis given positional nature of the pain.  Will check labs, chest x-ray  Diffuse ST elevation similar to previous.  Troponin is negative and D-dimer is positive at 0.89 Work-up is reassuring with negative troponin and negative chest x-ray.  CT angiogram shows no pulmonary embolism but does show gallstones the patient is aware of.  Do not feel these are contributing to his chest pain.  Pain is likely secondary to suspected pericarditis.  Discussed with Dr. Okey Dupre of cardiology who reviewed patient's EKG.  Have diffuse ST elevation and T wave changes similar to 2019.  Troponin remains negative.  Heart appears abnormal on CT scan with enlarged right ventricle.  And RV LV ratio or 1:1. May have pulmonary hypertension. No PE.  Dr. Okey Dupre agrees likely pericarditis but recommends outpatient follow-up with for echocardiogram versus inpatient admission.  Patient prefers to be admitted as he still having pain when he changes positions.    Discussed with Dr. Robb Matar.  We will arrange for echocardiogram and observation overnight.  Final Clinical Impression(s) / ED Diagnoses Final diagnoses:  Atypical chest pain  Acute pericarditis, unspecified type    Rx / DC Orders ED Discharge Orders     None        Taitum Alms, Jeannett Senior, MD 10/06/20 [phone]

## 2020-10-06 NOTE — H&P (Signed)
History and Physical    Marvin Rivera BJS:[id number] DOB: [birth year] DOA: 10/06/2020  PCP: Babs Sciara, MD   Patient coming from: Home.  I have personally briefly reviewed patient's old medical records in [facility]  Chief Complaint: Chest pain.  HPI: Marvin Rivera is a 30 y.o. male with medical history significant of hypertension, liver cyst, class III obesity, GERD who is coming to the emergency department due to midsternal sharp and outwards pressure-like chest pain radiated to his right shoulder associated with dyspnea.  The pain worsens with deep inspiration.  He denies lightheadedness, nausea, emesis, diaphoresis or palpitations that started around 2200 last night.  The patient states that earlier in the evening he felt some discomfort in his chest but then the pain worsened significantly when he lied down.  The patient also stated that every few weeks he feels like he is a skipping of heartbeat followed by a few seconds of generalized pain.  He denied fever, chills, sore throat, rhinorrhea, productive cough, wheezing or hemoptysis.  No abdominal pain, diarrhea, constipation, melena or hematochezia.  Denied dysuria, frequency or hematuria.  No polyuria, polydipsia, polyphagia or blurred vision.  ED Course: Initial vital signs were temperature 98.5 F, pulse 82, respirations 17, BP 146/86 mmHg and O2 sat 98% on room air.  The patient was given Maalox with lidocaine and 30 mg of ketorolac IM x1.  Lab work: CBC was normal.  D-dimer was 0.89 mcg/mL.  Troponin was 4 ng/L x 2 BMP showed a glucose of 104 mg/dL but was otherwise unremarkable.  Imaging: A portable chest radiograph did not show any acute cardiopulmonary abnormality.  CTA chest did not show any embolism or acute cardiopulmonary pathology.  The gallbladder was contracted with small gallstones.  Please see images and phonated report for further detail.  Review of Systems: As per HPI otherwise all other systems reviewed and  are negative.  Past Medical History:  Diagnosis Date   Hypertension    Liver cyst 2009    History reviewed. No pertinent surgical history.  Social History  reports that he has never smoked. He has never used smokeless tobacco. He reports current alcohol use. He reports that he does not use drugs.  Allergies  Allergen Reactions   Bee Venom     yellowjackets    Family History  Problem Relation Age of Onset   Lung cancer Mother    Atrial fibrillation Mother    Hypertension Mother    Atrial fibrillation Father    Heart disease Paternal Grandmother    Other Paternal Grandmother        carney complex   Lung cancer Paternal Grandfather    Prior to Admission medications   Not on File    Physical Exam: Vitals:   10/06/20 0410 10/06/20 0415 10/06/20 0430 10/06/20 0445  BP: 129/69  (!) 147/83   Pulse: 72 78 86 74  Resp: 16 (!) 24 18 19   Temp:      TempSrc:      SpO2: 99% 99% 99% 97%  Weight:      Height:       Constitutional: NAD, calm, comfortable Eyes: PERRL, lids and conjunctivae normal ENMT: Mucous membranes are moist. Posterior pharynx clear of any exudate or lesions. Neck: normal, supple, no masses, no thyromegaly Respiratory: clear to auscultation bilaterally, no wheezing, no crackles. Normal respiratory effort. No accessory muscle use.  Cardiovascular: Regular rate and rhythm, no murmurs / rubs / gallops. No extremity edema. 2+ pedal  pulses. No carotid bruits.  Abdomen: Obese, no distention.  BS positive.  Soft, no tenderness, no masses palpated. No hepatosplenomegaly. Musculoskeletal: no clubbing / cyanosis. Good ROM, no contractures. Normal muscle tone.  Skin: no rashes, lesions, ulcers on limited dermatological examination. Neurologic: CN 2-12 grossly intact. Sensation intact, DTR normal. Strength 5/5 in all 4.  Psychiatric: Normal judgment and insight. Alert and oriented x 3. Normal mood.   Labs on Admission: I have personally reviewed following labs and  imaging studies  CBC: Recent Labs  Lab 10/06/20 0129  WBC 9.7  HGB 13.6  HCT 41.1  MCV 91.3  PLT 315   Basic Metabolic Panel: Recent Labs  Lab 10/06/20 0129  NA 138  K 4.0  CL 103  CO2 29  GLUCOSE 104*  BUN 14  CREATININE 0.97  CALCIUM 9.2   GFR: Estimated Creatinine Clearance: 160.4 mL/min (by C-G formula based on SCr of 0.97 mg/dL).  Liver Function Tests: No results for input(s): AST, ALT, ALKPHOS, BILITOT, PROT, ALBUMIN in the last 168 hours.  Radiological Exams on Admission: CT Angio Chest PE W and/or Wo Contrast  Result Date: 10/06/2020 CLINICAL DATA:  Elevated D-dimer, chest pain EXAM: CT ANGIOGRAPHY CHEST WITH CONTRAST TECHNIQUE: Multidetector CT imaging of the chest was performed using the standard protocol during bolus administration of intravenous contrast. Multiplanar CT image reconstructions and MIPs were obtained to evaluate the vascular anatomy. CONTRAST:  OMNIPAQUE IOHEXOL 350 MG/ML SOLN COMPARISON:  None. FINDINGS: Cardiovascular: No filling defects in the pulmonary arteries to suggest pulmonary emboli. Heart is normal size. Aorta is normal caliber. Mediastinum/Nodes: No mediastinal, hilar, or axillary adenopathy. Trachea and esophagus are unremarkable. Thyroid unremarkable. Lungs/Pleura: Lungs are clear. No focal airspace opacities or suspicious nodules. No effusions. Upper Abdomen: Diffuse fatty infiltration of the liver. Gallbladder is contracted with small stones. Musculoskeletal: Chest wall soft tissues are unremarkable. No acute bony abnormality. Review of the MIP images confirms the above findings. IMPRESSION: No evidence of pulmonary embolus. No acute cardiopulmonary disease. Contracted gallbladder with small stones. Electronically Signed   By: Charlett Nose M.D.   On: 10/06/2020 03:38   DG Chest Portable 1 View  Result Date: 10/06/2020 CLINICAL DATA:  Midsternal chest pain with began at 8 p.m. last night, worse with lying down or bending over EXAM:  PORTABLE CHEST 1 VIEW COMPARISON:  05/15/2020 FINDINGS: No consolidation, features of edema, pneumothorax, or effusion. Pulmonary vascularity is normally distributed. The cardiomediastinal contours are unremarkable. No acute osseous or soft tissue abnormality. Telemetry leads overlie the chest. IMPRESSION: No acute cardiopulmonary abnormality. Electronically Signed   By: Kreg Shropshire M.D.   On: 10/06/2020 02:10    EKG: Independently reviewed.  Date/Time:   Saturday October 06 2020 03:53:39 EDT Ventricular Rate:  69 PR Interval:  154 QRS Duration: 94 QT Interval:  373 QTC Calculation: 400 R Axis:   60 Text Interpretation:  Sinus rhythm Borderline  T abnormalities, lateral leads Minimal ST elevation, inferior leads   Assessment/Plan Principal Problem:   Chest pain Pericarditis suspected. Placing observation/telemetry. Analgesics as needed. Obtain an echocardiogram. Will discuss with cardiology after echo results.  Active Problems:   Hypertension Was taking unspecified diuretic before. Did not tolerate and discontinued it due to muscle spasms. Begin lisinopril 10 mg p.o. daily.    Class 3 obesity  Has lost 40 pounds in the last few months. Continue lifestyle modifications.    Hyperglycemia This was a nonfasting sample. At high risk for development of DM. Continue weight loss and  lifestyle modifications. Follow-up with PCP.    Gastroesophageal reflux disease without esophagitis Asymptomatic at this time. H2 blocker or PPI as needed.    Small gallstones Check RUQ ultrasound when gallbladder not contracted. Outpatient study or inpatient after overnight fasting.   DVT prophylaxis: Lovenox. Code Status:   Full code. Family Communication:   Disposition Plan:   Patient is from:  Home.  Anticipated DC to:  Home.  Anticipated DC date:  10/06/2020 or 10/07/2020.  Anticipated DC barriers: Echocardiogram. Consults called:   Admission status:  Observation/telemetry.    Severity of Illness: High severity due to presenting with chest pain with abnormal EKG and high clinical suspicion of pericarditis.  Bobette Mo MD [facility]  How to contact the [facility] Attending or Consulting provider 7A - 7P or covering provider during after hours 7P -7A, for this patient?   Check the care team in [facility] and look for a) attending/consulting [facility] provider listed and b) the [facility] team listed Log into [email or web address] and use [facility] universal password to access. If you do not have the password, please contact the hospital operator. Locate the [facility] provider you are looking for under [facility] and page to a number that you can be directly reached. If you still have difficulty reaching the provider, please page the [facility] (Director on Call) for the Hospitalists listed on amion for assistance.  10/06/2020, 5:09 AM   This document was prepared using Dragon voice recognition software may contain some unintended transcription errors.

## 2020-10-08 ENCOUNTER — Ambulatory Visit (INDEPENDENT_AMBULATORY_CARE_PROVIDER_SITE_OTHER): Payer: Commercial Managed Care - PPO | Admitting: Family Medicine

## 2020-10-08 ENCOUNTER — Telehealth: Payer: Self-pay | Admitting: Family Medicine

## 2020-10-08 ENCOUNTER — Other Ambulatory Visit: Payer: Self-pay

## 2020-10-08 ENCOUNTER — Encounter: Payer: Self-pay | Admitting: Family Medicine

## 2020-10-08 VITALS — BP 130/86 | Temp 97.7°F | Wt 341.0 lb

## 2020-10-08 DIAGNOSIS — G4730 Sleep apnea, unspecified: Secondary | ICD-10-CM | POA: Diagnosis not present

## 2020-10-08 DIAGNOSIS — K8000 Calculus of gallbladder with acute cholecystitis without obstruction: Secondary | ICD-10-CM | POA: Diagnosis not present

## 2020-10-08 NOTE — Telephone Encounter (Signed)
Patient notified and will take appt at 4:30pm today

## 2020-10-08 NOTE — Progress Notes (Signed)
   Subjective:    Patient ID: Marvin Rivera, male    DOB: 05-09-1990, 30 y.o.   MRN: [id number]  HPI Pt here for hospital follow up. Pt was in hospital 10/06/20 for cholecystitis. Pt states he is still having soreness in chest.   Patient was in the hospital.  Labs x-rays reviewed with patient.  He describes severe discomfort in the midepigastric region radiating into the lower part of his chest.  Cardiac work-up negative.  Other work-up showed gallstones.  Evidence of chronic cholecystitis.  Patient would benefit from further evaluation with general surgery.  Patient also relates sleep apnea symptoms fatigue tiredness during the day.  He states he has lost some weight over the past several weeks by watching his diet and he is trying to lose more.  He does have chronically obstructed 1 side of the nasal passage.  States he snores at night and his wife states that he does pause with his breathing. Review of Systems See above    Objective:   Physical Exam Lungs are clear respiratory rate normal heart is regular abdomen is soft no guarding rebound or tenderness        Assessment & Plan:  1. Calculus of gallbladder with acute cholecystitis without obstruction Patient would benefit from referral to general surgery we will connect with Dr. Franky Macho regarding this patient.  See if potentially they can get him in relatively soon.  More than likely will have future attacks without intervention.  Healthy diet recommended. - Ambulatory referral to General Surgery  2. Sleep apnea, unspecified type Referral for sleep evaluation with him having partial nasal obstruction more than likely will need a full mask and does need a sleep study. - Ambulatory referral to Sleep Studies

## 2020-10-08 NOTE — Telephone Encounter (Signed)
He could come to the office at 4:20 PM (4:30 appt)

## 2020-10-08 NOTE — Telephone Encounter (Signed)
Please advise. Thank you

## 2020-10-08 NOTE — Telephone Encounter (Signed)
Patient was in ER this weekend with gall bladder issues and was told to follow up this week but wife is having surgery tomorrow and he could only come today if possible. He would like a referral to Dr.jenkins for gall bladder surgery.please advise

## 2020-10-16 ENCOUNTER — Other Ambulatory Visit: Payer: Self-pay

## 2020-10-16 ENCOUNTER — Ambulatory Visit: Payer: Commercial Managed Care - PPO | Admitting: General Surgery

## 2020-10-16 ENCOUNTER — Encounter: Payer: Self-pay | Admitting: General Surgery

## 2020-10-16 VITALS — BP 129/80 | HR 87 | Temp 98.3°F | Ht 71.0 in | Wt 344.4 lb

## 2020-10-16 DIAGNOSIS — K8020 Calculus of gallbladder without cholecystitis without obstruction: Secondary | ICD-10-CM | POA: Diagnosis not present

## 2020-10-16 NOTE — Progress Notes (Signed)
Marvin Rivera; [id number]; 15-Apr-1991   HPI Patient is a 30 year old white male who was referred to my care by Lilyan Punt for evaluation and treatment of cholelithiasis.  He was recently seen in the emergency room for chest pain.  Work-up was negative for cardiac etiology.  He was noted on ultrasound to have cholelithiasis with a contracted gallbladder.  His liver tests at that time were remarkable only for a total bilirubin of 1.9.  Since that visit, repeat liver enzyme tests were within normal limits.  He states he has not had significant right upper quadrant abdominal pain, nausea, vomiting, chest pain, or fatty food intolerance.  This was his first episode.  He denies any fever, chills, jaundice. Past Medical History:  Diagnosis Date   Hypertension    Liver cyst 2009    History reviewed. No pertinent surgical history.  Family History  Problem Relation Age of Onset   Lung cancer Mother    Atrial fibrillation Mother    Hypertension Mother    Atrial fibrillation Father    Heart disease Paternal Grandmother    Other Paternal Grandmother        carney complex   Lung cancer Paternal Grandfather     Current Outpatient Medications on File Prior to Visit  Medication Sig Dispense Refill   lisinopril (ZESTRIL) 10 MG tablet Take 1 tablet (10 mg total) by mouth daily. For BP 30 tablet 2   omeprazole (PRILOSEC OTC) 20 MG tablet Take 1 tablet (20 mg total) by mouth daily. 30 tablet 1   No current facility-administered medications on file prior to visit.    Allergies  Allergen Reactions   Bee Venom     yellowjackets    Social History   Substance and Sexual Activity  Alcohol Use Yes   Comment: rarely    Social History   Tobacco Use  Smoking Status Never  Smokeless Tobacco Never    Review of Systems  Constitutional: Negative.   HENT: Negative.    Eyes: Negative.   Respiratory: Negative.    Cardiovascular: Negative.   Gastrointestinal: Negative.   Genitourinary:  Negative.   Musculoskeletal: Negative.   Skin: Negative.   Neurological:  Positive for dizziness.  Endo/Heme/Allergies: Negative.   Psychiatric/Behavioral: Negative.     Objective   Vitals:   10/16/20 1539  BP: 129/80  Pulse: 87  Temp: 98.3 F (36.8 C)  SpO2: 95%    Physical Exam Vitals reviewed.  Constitutional:      Appearance: Normal appearance. He is obese. He is not ill-appearing.  HENT:     Head: Normocephalic and atraumatic.  Eyes:     General: No scleral icterus. Cardiovascular:     Rate and Rhythm: Normal rate and regular rhythm.     Heart sounds: Normal heart sounds. No murmur heard.   No friction rub. No gallop.  Pulmonary:     Effort: Pulmonary effort is normal. No respiratory distress.     Breath sounds: Normal breath sounds. No stridor. No wheezing, rhonchi or rales.  Abdominal:     General: Bowel sounds are normal. There is no distension.     Palpations: Abdomen is soft. There is no mass.     Tenderness: There is no abdominal tenderness. There is no guarding or rebound.     Hernia: No hernia is present.  Skin:    General: Skin is warm and dry.  Neurological:     Mental Status: He is alert and oriented to person, place, and time.  Primary care notes reviewed Assessment  Single episode of biliary colic secondary to cholelithiasis Plan  Patient would like to wait on laparoscopic cholecystectomy until later this year.  That is fine with me.  The risks and benefits of the procedure including bleeding, infection, hepatobiliary injury, and the possibility of an open procedure were fully explained to the patient, who gave informed consent.  He will call to schedule the surgery in the fall.  Literature was given.  He was instructed to call me sooner should his symptoms recur.

## 2020-11-14 ENCOUNTER — Encounter: Payer: Self-pay | Admitting: Nurse Practitioner

## 2020-11-14 ENCOUNTER — Ambulatory Visit (INDEPENDENT_AMBULATORY_CARE_PROVIDER_SITE_OTHER): Payer: Commercial Managed Care - PPO | Admitting: Nurse Practitioner

## 2020-11-14 ENCOUNTER — Other Ambulatory Visit: Payer: Self-pay

## 2020-11-14 VITALS — BP 112/73 | HR 94 | Temp 98.1°F | Ht 71.0 in | Wt 342.0 lb

## 2020-11-14 DIAGNOSIS — M5441 Lumbago with sciatica, right side: Secondary | ICD-10-CM

## 2020-11-14 MED ORDER — CHLORZOXAZONE 500 MG PO TABS: 500.0000 mg | ORAL_TABLET | Freq: Three times a day (TID) | ORAL | 0 refills | Status: DC | PRN

## 2020-11-14 MED ORDER — HYDROCODONE-ACETAMINOPHEN 5-325 MG PO TABS: 1.0000 | ORAL_TABLET | ORAL | 0 refills | Status: DC | PRN

## 2020-11-14 NOTE — Progress Notes (Signed)
Subjective:    Patient ID: Marvin Rivera, male    DOB: 1990/09/09, 30 y.o.   MRN: [id number]  HPI Presents for complaints of severe back pain that began on 7/16.  States all he did was reach down on the floor for his towel felt a sharp pain on the right side and instantly fell on the floor.  Was actually in the floor for several hours because of the pain and could not get up.  Has had a problem with his back before but this occurred at work while lifting something very heavy and the pain was not as intense as this.  Describes as severe spasms.  Has tried multiple OTC measurements with no improvement.  Sees a brief improvement with the massage device.  Wife is present with him today.  Is able to walk with some difficulty.  Describes pain in the right low back area with numbness in his right leg anterior.  Occasional numbness towards the feet and toes.  Denies any enuresis or trouble urinating.  Has noticed some pain when he strains to have a bowel movement.  Denies any injury to his abdomen or head with the fall.  Has tried hot and cold applications, alternating ibuprofen with Tylenol, massage and stretching.      Objective:   Physical Exam NAD.  Alert, oriented.  Patient in obvious pain.  Difficulty moving, shifts his weight to the left hip when sitting and has difficulty moving on table.  Lungs clear.  Heart regular rate rhythm.  No spinal tenderness.  Minimal tenderness with palpation of the low back area into the right buttock.  SLR negative on the left, positive on the right.  Patellar and Achilles reflexes 4+ bilaterally.  Strong DP pulses, toes warm with normal capillary refill.  Unable to do a full back exam due to his level of pain.  Gets on and off exam table with great difficulty, gait slow but steady.  His wife had to help him get his shoes on and off. Today's Vitals   11/14/20 1443  BP: 112/73  Pulse: 94  Temp: 98.1 F (36.7 C)  SpO2: 97%  Weight: (!) 342 lb (155.1 kg)  Height: 5'  11" (1.803 m)   Body mass index is 47.7 kg/m.        Assessment & Plan:  Acute right-sided low back pain with right-sided sciatica Meds ordered this encounter  Medications   chlorzoxazone (PARAFON) 500 MG tablet    Sig: Take 1 tablet (500 mg total) by mouth 3 (three) times daily as needed for muscle spasms.    Dispense:  30 tablet    Refill:  0    Order Specific Question:   Supervising Provider    Answer:   Lilyan Punt A [[id number]]   HYDROcodone-acetaminophen (NORCO/VICODIN) 5-325 MG tablet    Sig: Take 1 tablet by mouth every 4 (four) hours as needed.    Dispense:  20 tablet    Refill:  0    Order Specific Question:   Supervising Provider    Answer:   Babs Sciara [phone]   Encourage patient to move and stretch as much as possible to avoid stiffness and increased muscle spasms.  Continue current measures that he is already using. Add chlorzoxazone for severe muscle spasms.  Vicodin as directed for severe pain.  Drowsiness precautions.  Use caution in taking medications together.  Patient is wife verbalized understanding. Given written copy of back exercises and information on low back  pain. Offered work note if it is needed. Call back early next week if no improvement, sooner if worse.  Warning signs reviewed including bowel or urinary issues or worsening numbness and pain in his right leg.

## 2020-11-14 NOTE — Patient Instructions (Signed)
Acute Back Pain, Adult Acute back pain is sudden and usually short-lived. It is often caused by an injury to the muscles and tissues in the back. The injury may result from: A muscle or ligament getting overstretched or torn (strained). Ligaments are tissues that connect bones to each other. Lifting something improperly can cause a back strain. Wear and tear (degeneration) of the spinal disks. Spinal disks are circular tissue that provide cushioning between the bones of the spine (vertebrae). Twisting motions, such as while playing sports or doing yard work. A hit to the back. Arthritis. You may have a physical exam, lab tests, and imaging tests to find the cause ofyour pain. Acute back pain usually goes away with rest and home care. Follow these instructions at home: Managing pain, stiffness, and swelling Treatment may include medicines for pain and inflammation that are taken by mouth or applied to the skin, prescription pain medicine, or muscle relaxants. Take over-the-counter and prescription medicines only as told by your health care provider. Your health care provider may recommend applying ice during the first 24-48 hours after your pain starts. To do this: Put ice in a plastic bag. Place a towel between your skin and the bag. Leave the ice on for 20 minutes, 2-3 times a day. If directed, apply heat to the affected area as often as told by your health care provider. Use the heat source that your health care provider recommends, such as a moist heat pack or a heating pad. Place a towel between your skin and the heat source. Leave the heat on for 20-30 minutes. Remove the heat if your skin turns bright red. This is especially important if you are unable to feel pain, heat, or cold. You have a greater risk of getting burned. Activity  Do not stay in bed. Staying in bed for more than 1-2 days can delay your recovery. Sit up and stand up straight. Avoid leaning forward when you sit or  hunching over when you stand. If you work at a desk, sit close to it so you do not need to lean over. Keep your chin tucked in. Keep your neck drawn back, and keep your elbows bent at a 90-degree angle (right angle). Sit high and close to the steering wheel when you drive. Add lower back (lumbar) support to your car seat, if needed. Take short walks on even surfaces as soon as you are able. Try to increase the length of time you walk each day. Do not sit, drive, or stand in one place for more than 30 minutes at a time. Sitting or standing for long periods of time can put stress on your back. Do not drive or use heavy machinery while taking prescription pain medicine. Use proper lifting techniques. When you bend and lift, use positions that put less stress on your back: Bend your knees. Keep the load close to your body. Avoid twisting. Exercise regularly as told by your health care provider. Exercising helps your back heal faster and helps prevent back injuries by keeping muscles strong and flexible. Work with a physical therapist to make a safe exercise program, as recommended by your health care provider. Do any exercises as told by your physical therapist.  Lifestyle Maintain a healthy weight. Extra weight puts stress on your back and makes it difficult to have good posture. Avoid activities or situations that make you feel anxious or stressed. Stress and anxiety increase muscle tension and can make back pain worse. Learn ways to manage   anxiety and stress, such as through exercise. General instructions Sleep on a firm mattress in a comfortable position. Try lying on your side with your knees slightly bent. If you lie on your back, put a pillow under your knees. Follow your treatment plan as told by your health care provider. This may include: Cognitive or behavioral therapy. Acupuncture or massage therapy. Meditation or yoga. Contact a health care provider if: You have pain that is not  relieved with rest or medicine. You have increasing pain going down into your legs or buttocks. Your pain does not improve after 2 weeks. You have pain at night. You lose weight without trying. You have a fever or chills. Get help right away if: You develop new bowel or bladder control problems. You have unusual weakness or numbness in your arms or legs. You develop nausea or vomiting. You develop abdominal pain. You feel faint. Summary Acute back pain is sudden and usually short-lived. Use proper lifting techniques. When you bend and lift, use positions that put less stress on your back. Take over-the-counter and prescription medicines and apply heat or ice as directed by your health care provider. This information is not intended to replace advice given to you by your health care provider. Make sure you discuss any questions you have with your healthcare provider. Document Revised: 01/03/2020 Document Reviewed: 01/06/2020 Elsevier Patient Education  2022 [employer]  

## 2020-11-15 ENCOUNTER — Encounter: Payer: Self-pay | Admitting: Nurse Practitioner

## 2020-12-12 ENCOUNTER — Other Ambulatory Visit: Payer: Self-pay

## 2020-12-12 ENCOUNTER — Ambulatory Visit: Payer: Commercial Managed Care - PPO | Admitting: Neurology

## 2020-12-12 ENCOUNTER — Encounter: Payer: Self-pay | Admitting: Neurology

## 2020-12-12 VITALS — BP 139/87 | HR 79 | Ht 71.0 in | Wt 347.4 lb

## 2020-12-12 DIAGNOSIS — R519 Headache, unspecified: Secondary | ICD-10-CM

## 2020-12-12 DIAGNOSIS — R0681 Apnea, not elsewhere classified: Secondary | ICD-10-CM

## 2020-12-12 DIAGNOSIS — R0683 Snoring: Secondary | ICD-10-CM

## 2020-12-12 DIAGNOSIS — Z6841 Body Mass Index (BMI) 40.0 and over, adult: Secondary | ICD-10-CM

## 2020-12-12 DIAGNOSIS — G4719 Other hypersomnia: Secondary | ICD-10-CM

## 2020-12-12 DIAGNOSIS — Z820 Family history of epilepsy and other diseases of the nervous system: Secondary | ICD-10-CM

## 2020-12-12 NOTE — Patient Instructions (Signed)

## 2020-12-12 NOTE — Progress Notes (Signed)
Subjective:    Patient ID: Marvin Rivera is a 30 y.o. male.  HPI    Huston Foley, MD, PhD [facility] [address] [address] [place], [place] [address]  Dear Dr. Gerda Diss,  I saw your patient, Marvin Rivera, upon your kind request in my sleep clinic today for initial consultation of his sleep disorder, in particular, concern for underlying obstructive sleep apnea.  The patient is unaccompanied today.  As you know, [name] is a 30 year old right-handed gentleman with an underlying medical history of cholecystitis, chest pain, hypertension, liver cyst, and severe obesity with a BMI of over 45, who reports snoring and excessive daytime somnolence as well as pauses in his breathing while asleep.  I reviewed your office note from 10/08/2020.  His Epworth sleepiness score is 15 out of 24, fatigue severity score is 48 out of 63.  He has woken up with a headache, he has fairly frequent morning headaches, describes the headaches as bifrontal, dull and aching, not severe enough to take medication.  His brother has recently been diagnosed with sleep apnea.  Patient reports that when he was 17, he had a fractured nose and never had corrected.  He has trouble breathing through his nose, 1 nostril always seems to be stopped out but it could be either side.  Weight has been fluctuating.  He lives with his family including wife and 3 children, ages 57, 42 and 2.  His 51-year-old is not a good sleeper.  He has a bedtime of around 1 AM, has trouble going to sleep at night, he had trouble going to sleep at night when he was a child.  Rise time is around 7:30 AM.  He does not have night to night nocturia.  He drinks caffeine in the form of soda, typically a 20 ounce serving per day, quit smoking in the distant past, did not smoke for long period he drinks alcohol rarely.  They have 1 dog in the household.  He does have a TV in the bedroom but does not typically watch it there.  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  His Past Medical History Is Significant For: Past Medical History:  Diagnosis Date   Hypertension    Liver cyst 2009    His Past Surgical History Is Significant For: History reviewed. No pertinent surgical history.  His Family History Is Significant For: Family History  Problem Relation Age of Onset   Lung cancer Mother    Atrial fibrillation Mother    Hypertension Mother    Atrial fibrillation Father    Sleep apnea Brother    Heart disease Paternal Grandmother    Other Paternal Grandmother        carney complex   Lung cancer Paternal Grandfather     His Social History Is Significant For: Social History   Socioeconomic History   Marital status: Married    Spouse name: Not on file   Number of children: 3   Years of education: Not on file   Highest education level: Not on file  Occupational History   Not on file  Tobacco Use   Smoking status: Never   Smokeless tobacco: Never  Vaping Use   Vaping Use: Never used  Substance and Sexual Activity   Alcohol use: Yes    Comment: rarely   Drug use: No   Sexual activity: Not on file  Other Topics Concern   Not on file  Social History Narrative  Not on file   Social Determinants of Health   Financial Resource Strain: Not on file  Food Insecurity: Not on file  Transportation Needs: Not on file  Physical Activity: Not on file  Stress: Not on file  Social Connections: Not on file    His Allergies Are:  Allergies  Allergen Reactions   Bee Venom     yellowjackets  :   His Current Medications Are:  Outpatient Encounter Medications as of 12/12/2020  Medication Sig   chlorzoxazone (PARAFON) 500 MG tablet Take 1 tablet (500 mg total) by mouth 3 (three) times daily as needed for muscle spasms.   HYDROcodone-acetaminophen (NORCO/VICODIN) 5-325 MG tablet Take 1 tablet by mouth every 4 (four) hours as needed.   lisinopril (ZESTRIL) 10 MG tablet Take 1 tablet (10 mg total) by mouth daily. For BP    omeprazole (PRILOSEC OTC) 20 MG tablet Take 1 tablet (20 mg total) by mouth daily.   No facility-administered encounter medications on file as of 12/12/2020.  :   Review of Systems:  Out of a complete 14 point review of systems, all are reviewed and negative with the exception of these symptoms as listed below:  Review of Systems  Neurological:        Pt is here today for a sleep consult. Pt states has a headache and is fatigue when gets up in the morning. Pt states gets 4 to 5 hours of sleep a night . Never had a sleep consult   Epworth Sleepiness Scale 0= would never doze 1= slight chance of dozing 2= moderate chance of dozing 3= high chance of dozing  Sitting and reading:1 Watching TV:1 Sitting inactive in a public place (ex. Theater or meeting):3 As a passenger in a car for an hour without a break:1 Lying down to rest in the afternoon:3 Sitting and talking to someone:1 Sitting quietly after lunch (no alcohol):3 In a car, while stopped in traffic:2 Total: 15   Objective:  Neurological Exam  Physical Exam Physical Examination:   Vitals:   12/12/20 1539  BP: 139/87  Pulse: 79    General Examination: The patient is a very pleasant 30 y.o. male in no acute distress. He appears well-developed and well-nourished and well groomed.   HEENT: Normocephalic, atraumatic, pupils are equal, round and reactive to light, extraocular tracking is good without limitation to gaze excursion or nystagmus noted. Hearing is grossly intact. Face is symmetric with normal facial animation. Speech is clear with no dysarthria noted.  Nasal sounding speech.  There is no hypophonia. There is no lip, neck/head, jaw or voice tremor. Neck is supple with full range of passive and active motion. There are no carotid bruits on auscultation. Oropharynx exam reveals: mild mouth dryness, good dental hygiene and moderate airway crowding, due to small airway entry, uvula slightly prominent, tonsillar size 1-2+ on  the left and 1+ on the right.  Nasal inspection reveals mildly deformed nose from the outside, no significant deviated septum but narrow nasal passages bilaterally.  Tongue protrudes centrally and palate elevates symmetrically, Mallampati class III.  Neck circumference of 20 1/4 inches.  He has a minimal overbite.    Chest: Clear to auscultation without wheezing, rhonchi or crackles noted.  Heart: S1+S2+0, regular and normal without murmurs, rubs or gallops noted.   Abdomen: Soft, non-tender and non-distended with normal bowel sounds appreciated on auscultation.  Extremities: There is no pitting edema in the distal lower extremities bilaterally.   Skin: Warm and dry without trophic  changes noted.   Musculoskeletal: exam reveals no obvious joint deformities, tenderness or joint swelling or erythema.   Neurologically:  Mental status: The patient is awake, alert and oriented in all 4 spheres. His immediate and remote memory, attention, language skills and fund of knowledge are appropriate. There is no evidence of aphasia, agnosia, apraxia or anomia. Speech is clear with normal prosody and enunciation. Thought process is linear. Mood is normal and affect is normal.  Cranial nerves II - XII are as described above under HEENT exam.  Motor exam: Normal bulk, strength and tone is noted. There is no tremor, Romberg is negative. Fine motor skills and coordination: grossly intact.  Cerebellar testing: No dysmetria or intention tremor. There is no truncal or gait ataxia.  Sensory exam: intact to light touch in the upper and lower extremities.  Gait, station and balance: He stands easily. No veering to one side is noted. No leaning to one side is noted. Posture is age-appropriate and stance is narrow based. Gait shows normal stride length and normal pace. No problems turning are noted. Tandem walk is unremarkable.                Assessment and Plan:  In summary, MITHCELL SCHUMPERT is a very pleasant 30  y.o.-year old male with an underlying medical history of cholecystitis, chest pain, hypertension, liver cyst, and severe obesity with a BMI of over 45, whose history and physical exam are concerning for obstructive sleep apnea (OSA). I had a long chat with the patient about my findings and the diagnosis of OSA, its prognosis and treatment options. We talked about medical treatments, surgical interventions and non-pharmacological approaches. I explained in particular the risks and ramifications of untreated moderate to severe OSA, especially with respect to developing cardiovascular disease down the Road, including congestive heart failure, difficult to treat hypertension, cardiac arrhythmias, or stroke. Even type 2 diabetes has, in part, been linked to untreated OSA. Symptoms of untreated OSA include daytime sleepiness, memory problems, mood irritability and mood disorder such as depression and anxiety, lack of energy, as well as recurrent headaches, especially morning headaches. We talked about trying to maintain a healthy lifestyle in general, as well as the importance of weight control. We also talked about the importance of good sleep hygiene. I recommended the following at this time: sleep study.  I outlined the difference between a laboratory attended sleep study versus home sleep test.  I also explained to him possible surgical and non-surgical treatment options of OSA, including the use of a custom-made dental device (which would require a referral to a specialist dentist or oral surgeon), upper airway surgical options, such as traditional UPPP or a novel less invasive surgical option in the form of Inspire hypoglossal nerve stimulation (which would involve a referral to an ENT surgeon). I also explained the CPAP treatment option to the patient, who indicated that he would be willing to try CPAP if the need arises.  We will pick up our discussion after testing.  We will keep him posted as to his test  results by phone call in the interim as well.  I answered all his questions today and he was in agreement.   Thank you very much for allowing me to participate in the care of this nice patient. If I can be of any further assistance to you please do not hesitate to call me at [phone].  Sincerely,   Huston Foley, MD, PhD

## 2020-12-27 ENCOUNTER — Ambulatory Visit: Payer: Commercial Managed Care - PPO | Admitting: Family Medicine

## 2021-01-07 ENCOUNTER — Telehealth: Payer: Self-pay | Admitting: Family Medicine

## 2021-01-07 ENCOUNTER — Ambulatory Visit (INDEPENDENT_AMBULATORY_CARE_PROVIDER_SITE_OTHER): Payer: Commercial Managed Care - PPO | Admitting: Family Medicine

## 2021-01-07 ENCOUNTER — Other Ambulatory Visit: Payer: Self-pay

## 2021-01-07 VITALS — BP 134/84 | HR 87 | Temp 98.4°F | Ht 71.0 in | Wt 353.0 lb

## 2021-01-07 DIAGNOSIS — Z13220 Encounter for screening for lipoid disorders: Secondary | ICD-10-CM

## 2021-01-07 DIAGNOSIS — L729 Follicular cyst of the skin and subcutaneous tissue, unspecified: Secondary | ICD-10-CM

## 2021-01-07 DIAGNOSIS — Z1159 Encounter for screening for other viral diseases: Secondary | ICD-10-CM

## 2021-01-07 DIAGNOSIS — Z79899 Other long term (current) drug therapy: Secondary | ICD-10-CM

## 2021-01-07 DIAGNOSIS — Z0000 Encounter for general adult medical examination without abnormal findings: Secondary | ICD-10-CM

## 2021-01-07 DIAGNOSIS — N50812 Left testicular pain: Secondary | ICD-10-CM

## 2021-01-07 DIAGNOSIS — I10 Essential (primary) hypertension: Secondary | ICD-10-CM

## 2021-01-07 NOTE — Patient Instructions (Signed)

## 2021-01-07 NOTE — Telephone Encounter (Signed)
Patient needing labs for physical 11/16

## 2021-01-07 NOTE — Progress Notes (Signed)
   Subjective:    Patient ID: Marvin Rivera, male    DOB: 27-Feb-1991, 30 y.o.   MRN: [id number]  HPI Penile discomfort - states happens randomly at times x 2 months Patient relates actual testicular discomfort on the left side that has been present over the past several weeks causes him to sometimes have a ache and a pain in the discomfort he states that it was noticed by his wife that he appears to have a lump in that area.  Denies any other particular troubles.  Review of Systems     Objective:   Physical Exam Lungs clear heart regular  Testicular exam right side normal left side has what appears to be either a cyst or varicocele right above the left testicle the testicle itself does not have any mass that I can feel        Assessment & Plan:  Left testicular pain Possible cyst versus varicocele Ultrasound of the testicle is recommended Await the results   Elevated blood pressure healthy eating regular physical activity and walking recommended.  Patient currently not taking blood pressure medicine which seems to be okay  He should do a wellness exam later this year he was encouraged to schedule this

## 2021-01-07 NOTE — Telephone Encounter (Signed)
Metabolic 7, lipid, liver, hepatitis C antibody Wellness, hepatitis C screening

## 2021-01-08 NOTE — Telephone Encounter (Signed)
Lab orders placed and pt is aware 

## 2021-01-15 ENCOUNTER — Ambulatory Visit (HOSPITAL_COMMUNITY)
Admission: RE | Admit: 2021-01-15 | Discharge: 2021-01-15 | Disposition: A | Discharge status: Home / Self Care | Payer: Commercial Managed Care - PPO | Source: Ambulatory Visit | Attending: Family Medicine | Admitting: Family Medicine

## 2021-01-15 ENCOUNTER — Other Ambulatory Visit: Payer: Self-pay

## 2021-01-15 DIAGNOSIS — L729 Follicular cyst of the skin and subcutaneous tissue, unspecified: Secondary | ICD-10-CM | POA: Diagnosis present

## 2021-01-15 DIAGNOSIS — N50812 Left testicular pain: Secondary | ICD-10-CM | POA: Diagnosis present

## 2021-01-16 ENCOUNTER — Other Ambulatory Visit: Payer: Self-pay

## 2021-01-16 DIAGNOSIS — N50812 Left testicular pain: Secondary | ICD-10-CM

## 2021-01-16 DIAGNOSIS — L729 Follicular cyst of the skin and subcutaneous tissue, unspecified: Secondary | ICD-10-CM

## 2021-02-04 ENCOUNTER — Ambulatory Visit (INDEPENDENT_AMBULATORY_CARE_PROVIDER_SITE_OTHER): Payer: Commercial Managed Care - PPO | Admitting: Neurology

## 2021-02-04 DIAGNOSIS — R0683 Snoring: Secondary | ICD-10-CM

## 2021-02-04 DIAGNOSIS — R519 Headache, unspecified: Secondary | ICD-10-CM

## 2021-02-04 DIAGNOSIS — G4733 Obstructive sleep apnea (adult) (pediatric): Secondary | ICD-10-CM

## 2021-02-04 DIAGNOSIS — R0681 Apnea, not elsewhere classified: Secondary | ICD-10-CM

## 2021-02-04 DIAGNOSIS — Z820 Family history of epilepsy and other diseases of the nervous system: Secondary | ICD-10-CM

## 2021-02-04 DIAGNOSIS — G4719 Other hypersomnia: Secondary | ICD-10-CM

## 2021-02-11 NOTE — Progress Notes (Signed)
See procedure note.

## 2021-02-12 NOTE — Procedures (Signed)
   [facility]  HOME SLEEP TEST (Watch PAT) REPORT  STUDY DATE: 02/04/2021  DOB: 1991-02-25  MRN: [id number]  ORDERING CLINICIAN: Huston Foley, MD, PhD   REFERRING CLINICIAN: Babs Sciara, MD   CLINICAL INFORMATION/HISTORY:  30 year old right-handed gentleman with an underlying medical history of cholecystitis, chest pain, hypertension, liver cyst, and severe obesity with a BMI of over 45, who reports snoring and excessive daytime somnolence as well as pauses in his breathing while asleep.   Epworth sleepiness score: 15/24.  BMI: 48.8 kg/m  FINDINGS:   Sleep Summary:   Total Recording Time (hours, min): 8 hours: 46 minutes  Total Sleep Time (hours, min):  6 hours 28 minutes   Percent REM (%):    25%   Respiratory Indices:   Calculated pAHI (per hour):  15.2/hour         REM pAHI:    Into/hour       NREM pAHI: 18.8/hour  Oxygen Saturation Statistics:    Oxygen Saturation (%) Mean: 96%   Minimum oxygen saturation (%):                 85%   O2 Saturation Range (%): 85-99%    O2 Saturation (minutes) <=88%: 0.2 min  Pulse Rate Statistics:   Pulse Mean (bpm):    68/min    Pulse Range (37-110/min)   IMPRESSION: OSA (obstructive sleep apnea)   RECOMMENDATION:  This home sleep test demonstrates moderate obstructive sleep apnea with a total AHI of 15.2/hour and O2 nadir of 85%.  Snoring was detected in the mild-to-moderate range, at times louder. Treatment with positive airway pressure is recommended. The patient will be advised to proceed with an autoPAP titration/trial at home for now. A full night titration study may be considered to optimize treatment settings, if needed down the road.  Alternative treatment options include possible dental treatment with an oral appliance or surgical options.  Concomitant weight loss is recommended.  Please note that untreated obstructive sleep apnea may carry additional perioperative morbidity. Patients with  significant obstructive sleep apnea should receive perioperative PAP therapy and the surgeons and particularly the anesthesiologist should be informed of the diagnosis and the severity of the sleep disordered breathing. The patient should be cautioned not to drive, work at heights, or operate dangerous or heavy equipment when tired or sleepy. Review and reiteration of good sleep hygiene measures should be pursued with any patient. Other causes of the patient's symptoms, including circadian rhythm disturbances, an underlying mood disorder, medication effect and/or an underlying medical problem cannot be ruled out based on this test. Clinical correlation is recommended. The patient and his referring provider will be notified of the test results. The patient will be seen in follow up in sleep clinic at [facility].  I certify that I have reviewed the raw data recording prior to the issuance of this report in accordance with the standards of the American Academy of Sleep Medicine (AASM).   INTERPRETING PHYSICIAN:   Huston Foley, MD, PhD  Board Certified in Neurology and Sleep Medicine  [facility] [address], [address] [place], [place] [address] [phone]

## 2021-02-12 NOTE — Addendum Note (Signed)
Addended by: Huston Foley on: 02/12/2021 04:58 PM   Modules accepted: Orders

## 2021-02-13 ENCOUNTER — Telehealth: Payer: Self-pay | Admitting: *Deleted

## 2021-02-13 ENCOUNTER — Encounter: Payer: Self-pay | Admitting: *Deleted

## 2021-02-13 NOTE — Telephone Encounter (Signed)
-----   Message from Huston Foley, MD sent at 02/12/2021  4:58 PM EDT ----- Patient referred by Dr. Gerda Diss, seen by me on 12/12/2020, patient had a HST on 02/04/2021.    Please call and notify the patient that the recent home sleep test showed obstructive sleep apnea in the moderate range. I recommend treatment in the form of autoPAP, which means, that we don't have to bring him in for a sleep study with CPAP, but will let him start using a so called autoPAP machine at home, which is a CPAP-like machine with self-adjusting pressures. We will send the order to a local DME company (of his choice, or as per insurance requirement). The DME representative will fit him with a mask, educate him on how to use the machine, how to put the mask on, etc. I have placed an order in the chart. Please send the order, talk to patient, send report to referring MD. We will need a FU in sleep clinic for 10 weeks post-PAP set up, please arrange that with me or one of our NPs. Also reinforce the need for compliance with treatment. Thanks,   Huston Foley, MD, PhD [facility])

## 2021-02-13 NOTE — Telephone Encounter (Signed)
I spoke with the patient and discussed home sleep test results as noted below by Dr. Frances Furbish.  Patient aware of compliance requirements including using machine at least 4 hours every night and being seen in the office and he is in agreement to start AutoPap.  He does not have a preference of DME company.  I let him know a message would be sent to [facility] [facility] with orders.  Patient went ahead and scheduled a follow-up on May 02, 2021 at 8:45 AM arrival 8:15 AM.  He will bring his machine and power cord with him to the first appointment.  Patient aware that his appointment needs to fall between 31 to 89 days after he starts using his machine so he is to call us if this appointment does not fall in that date range. The patient's questions were answered and he verbalized appreciation for the call.  He also confirmed his address so I can send a letter to his home.  Sent order to [employer] securely.  Results sent to Dr. Gerda Diss. Letter sent to patient.

## 2021-03-05 ENCOUNTER — Other Ambulatory Visit: Payer: Self-pay

## 2021-03-05 ENCOUNTER — Ambulatory Visit
Admission: RE | Admit: 2021-03-05 | Discharge: 2021-03-05 | Disposition: A | Discharge status: Home / Self Care | Payer: Commercial Managed Care - PPO | Source: Ambulatory Visit | Attending: Student | Admitting: Student

## 2021-03-05 VITALS — BP 141/94 | HR 87 | Temp 98.6°F | Resp 18

## 2021-03-05 DIAGNOSIS — M10071 Idiopathic gout, right ankle and foot: Secondary | ICD-10-CM

## 2021-03-05 DIAGNOSIS — Z0189 Encounter for other specified special examinations: Secondary | ICD-10-CM

## 2021-03-05 MED ORDER — PREDNISONE 20 MG PO TABS: 40.0000 mg | ORAL_TABLET | Freq: Every day | ORAL | 0 refills | Status: AC

## 2021-03-05 NOTE — Discharge Instructions (Addendum)
-  Prednisone, 2 pills taken at the same time for 5 days in a row.  Try taking this earlier in the day as it can give you energy. Avoid NSAIDs like ibuprofen and alleve while taking this medication as they can increase your risk of stomach upset and even GI bleeding when in combination with a steroid. You can continue tylenol (acetaminophen) up to 1000mg  3x daily. -Avoid red meat, seafood, beer

## 2021-03-05 NOTE — ED Triage Notes (Signed)
Gout flare up x 1 week.  Has become harder to walk on foot. Swelling noted across toes of right foot.

## 2021-03-05 NOTE — ED Provider Notes (Signed)
[facility]    CSN: [id number] Arrival date & time: 03/05/21  1548      History   Chief Complaint No chief complaint on file.   HPI KARREEM CAN is a 30 y.o. male presenting with right foot gout for 1 week following drinking monster energy drinks.  Medical history gout, last about 8 months ago per patient.  Describes pain that initially started at the right first MTP joint, but then spread to the second MTP joint and midfoot.  Ambulating with pain.  Denies sensation changes, trauma, overuse.  Denies red meat, beer, seafood.  HPI  Past Medical History:  Diagnosis Date   Hypertension    Liver cyst 2009    Patient Active Problem List   Diagnosis Date Noted   Chest pain 10/06/2020   Chronic cholecystitis with calculus 10/06/2020   Hypertension    Class 3 obesity     Hyperglycemia    Gastroesophageal reflux disease without esophagitis 09/13/2017    History reviewed. No pertinent surgical history.     Home Medications    Prior to Admission medications   Medication Sig Start Date End Date Taking? Authorizing Provider  predniSONE (DELTASONE) 20 MG tablet Take 2 tablets (40 mg total) by mouth daily for 5 days. Take with breakfast or lunch. Avoid NSAIDs (ibuprofen, etc) while taking this medication. 03/05/21 03/10/21 Yes Hazel Sams, PA-C    Family History Family History  Problem Relation Age of Onset   Lung cancer Mother    Atrial fibrillation Mother    Hypertension Mother    Atrial fibrillation Father    Sleep apnea Brother    Heart disease Paternal Grandmother    Other Paternal Grandmother        carney complex   Lung cancer Paternal Grandfather     Social History Social History   Tobacco Use   Smoking status: Never   Smokeless tobacco: Never  Vaping Use   Vaping Use: Never used  Substance Use Topics   Alcohol use: Yes    Comment: rarely   Drug use: No     Allergies   Bee venom   Review of Systems Review of Systems   Musculoskeletal:        R foot pain  All other systems reviewed and are negative.   Physical Exam Triage Vital Signs ED Triage Vitals  Enc Vitals Group     BP 03/05/21 1637 (!) 141/94     Pulse Rate 03/05/21 1637 87     Resp 03/05/21 1637 18     Temp 03/05/21 1637 98.6 F (37 C)     Temp Source 03/05/21 1637 Oral     SpO2 03/05/21 1637 97 %     Weight --      Height --      Head Circumference --      Peak Flow --      Pain Score 03/05/21 1638 4     Pain Loc --      Pain Edu? --      Excl. in [place]? --    No data found.  Updated Vital Signs BP (!) 141/94 (BP Location: Right Arm)   Pulse 87   Temp 98.6 F (37 C) (Oral)   Resp 18   SpO2 97%   Visual Acuity Right Eye Distance:   Left Eye Distance:   Bilateral Distance:    Right Eye Near:   Left Eye Near:    Bilateral Near:  Physical Exam Vitals reviewed.  Constitutional:      General: He is not in acute distress.    Appearance: Normal appearance. He is not ill-appearing.  HENT:     Head: Normocephalic and atraumatic.  Pulmonary:     Effort: Pulmonary effort is normal.  Musculoskeletal:     Comments: R foot- TTP 1st and 2nd MTP joints with effusion. Also with effusion to dorsum of foot without point tenderness. DP 2+, cap refil <2 seconds. No malleolar tenderness.   Neurological:     General: No focal deficit present.     Mental Status: He is alert and oriented to person, place, and time.  Psychiatric:        Mood and Affect: Mood normal.        Behavior: Behavior normal.        Thought Content: Thought content normal.        Judgment: Judgment normal.     UC Treatments / Results  Labs (all labs ordered are listed, but only abnormal results are displayed) Labs Reviewed  URIC ACID    EKG   Radiology No results found.  Procedures Procedures (including critical care time)  Medications Ordered in UC Medications - No data to display  Initial Impression / Assessment and Plan / UC Course  I  have reviewed the triage vital signs and the nursing notes.  Pertinent labs & imaging results that were available during my care of the patient were reviewed by me and considered in my medical decision making (see chart for details).     This patient is a very pleasant 30 y.o. year old male presenting with gout R foot. Neurovascularly intact. Will check a uric acid at patient request. Prednisone as below. Low-purine diet. ED return precautions discussed. Patient verbalizes understanding and agreement.  .   Final Clinical Impressions(s) / UC Diagnoses   Final diagnoses:  Acute idiopathic gout of right foot  Routine lab draw     Discharge Instructions      -Prednisone, 2 pills taken at the same time for 5 days in a row.  Try taking this earlier in the day as it can give you energy. Avoid NSAIDs like ibuprofen and alleve while taking this medication as they can increase your risk of stomach upset and even GI bleeding when in combination with a steroid. You can continue tylenol (acetaminophen) up to 1000mg  3x daily. -Avoid red meat, seafood, beer   ED Prescriptions     Medication Sig Dispense Auth. Provider   predniSONE (DELTASONE) 20 MG tablet Take 2 tablets (40 mg total) by mouth daily for 5 days. Take with breakfast or lunch. Avoid NSAIDs (ibuprofen, etc) while taking this medication. 10 tablet , PA-C      PDMP not reviewed this encounter.   Rhys Martini, PA-C 03/05/21 1701

## 2021-03-06 LAB — URIC ACID: Uric Acid: 8.2 mg/dL (ref 3.8–8.4)

## 2021-03-13 ENCOUNTER — Encounter: Payer: Self-pay | Admitting: Family Medicine

## 2021-03-13 ENCOUNTER — Ambulatory Visit (INDEPENDENT_AMBULATORY_CARE_PROVIDER_SITE_OTHER): Payer: Commercial Managed Care - PPO | Admitting: Family Medicine

## 2021-03-13 ENCOUNTER — Other Ambulatory Visit: Payer: Self-pay

## 2021-03-13 VITALS — BP 138/88 | HR 71 | Temp 97.3°F | Ht 71.0 in | Wt 356.0 lb

## 2021-03-13 DIAGNOSIS — M1A9XX0 Chronic gout, unspecified, without tophus (tophi): Secondary | ICD-10-CM | POA: Diagnosis not present

## 2021-03-13 DIAGNOSIS — Z0001 Encounter for general adult medical examination with abnormal findings: Secondary | ICD-10-CM | POA: Diagnosis not present

## 2021-03-13 DIAGNOSIS — K047 Periapical abscess without sinus: Secondary | ICD-10-CM | POA: Diagnosis not present

## 2021-03-13 DIAGNOSIS — Z0000 Encounter for general adult medical examination without abnormal findings: Secondary | ICD-10-CM

## 2021-03-13 MED ORDER — PENICILLIN V POTASSIUM 500 MG PO TABS: 500.0000 mg | ORAL_TABLET | Freq: Four times a day (QID) | ORAL | 0 refills | Status: AC

## 2021-03-13 MED ORDER — COLCHICINE 0.6 MG PO TABS: 0.6000 mg | ORAL_TABLET | Freq: Every day | ORAL | 0 refills | Status: DC

## 2021-03-13 MED ORDER — ALLOPURINOL 100 MG PO TABS: 100.0000 mg | ORAL_TABLET | Freq: Every day | ORAL | 5 refills | Status: DC

## 2021-03-13 NOTE — Progress Notes (Signed)
   Subjective:    Patient ID: Marvin Rivera, male    DOB: 1991/04/03, 30 y.o.   MRN: [id number]  HPI The patient comes in today for a wellness visit.    A review of their health history was completed.  A review of medications was also completed.  Any needed refills; no  Eating habits: fair  Falls/  MVA accidents in past few months: no  Regular exercise: works full time  Specialist pt sees on regular basis: none  Preventative health issues were discussed.   Additional concerns: gout flare U?C 2 weeks ago   Patient has had a couple different episodes of gout.  He is interested in being on medication to help suppress his Patient states his weight is gone up recently he has not been watching his diet as well as he should not exercising outside of work Patient denies any type of chest tightness pressure pain shortness of breath He also relates a tooth abscess on the left upper area once to see dentist in the near future he will set this up Review of Systems     Objective:   Physical Exam General-in no acute distress Eyes-no discharge Lungs-respiratory rate normal, CTA CV-no murmurs,RRR Extremities skin warm dry no edema Neuro grossly normal Behavior normal, alert   Flu shot patient defers     Assessment & Plan:  Borderline blood pressure patient will work hard on diet work harder on healthier eating as well as regular walking follow-up again within 3 months time to check blood pressure  Adult wellness-complete.wellness physical was conducted today. Importance of diet and exercise were discussed in detail.  In addition to this a discussion regarding safety was also covered. We also reviewed over immunizations and gave recommendations regarding current immunization needed for age.  In addition to this additional areas were also touched on including: Preventative health exams needed:  Colonoscopy not indicated  Patient was advised yearly wellness  exam  Gallop-allopurinol 100 mg every day Colchicine 1 daily for the next 3 weeks while on allopurinol Recheck labs again in 2 to 3 months Adjust allopurinol accordingly Low purine diet  Tooth abscess recommend antibiotics next 7 days as directed

## 2021-03-13 NOTE — Patient Instructions (Addendum)
DASH Eating Plan DASH stands for Dietary Approaches to Stop Hypertension. The DASH eating plan is a healthy eating plan that has been shown to: Reduce high blood pressure (hypertension). Reduce your risk for type 2 diabetes, heart disease, and stroke. Help with weight loss. What are tips for following this plan? Reading food labels Check food labels for the amount of salt (sodium) per serving. Choose foods with less than 5 percent of the Daily Value of sodium. Generally, foods with less than 300 milligrams (mg) of sodium per serving fit into this eating plan. To find whole grains, look for the word "whole" as the first word in the ingredient list. Shopping Buy products labeled as "low-sodium" or "no salt added." Buy fresh foods. Avoid canned foods and pre-made or frozen meals. Cooking Avoid adding salt when cooking. Use salt-free seasonings or herbs instead of table salt or sea salt. Check with your health care provider or pharmacist before using salt substitutes. Do not fry foods. Cook foods using healthy methods such as baking, boiling, grilling, roasting, and broiling instead. Cook with heart-healthy oils, such as olive, canola, avocado, soybean, or sunflower oil. Meal planning  Eat a balanced diet that includes: 4 or more servings of fruits and 4 or more servings of vegetables each day. Try to fill one-half of your plate with fruits and vegetables. 6-8 servings of whole grains each day. Less than 6 oz (170 g) of lean meat, poultry, or fish each day. A 3-oz (85-g) serving of meat is about the same size as a deck of cards. One egg equals 1 oz (28 g). 2-3 servings of low-fat dairy each day. One serving is 1 cup (237 mL). 1 serving of nuts, seeds, or beans 5 times each week. 2-3 servings of heart-healthy fats. Healthy fats called omega-3 fatty acids are found in foods such as walnuts, flaxseeds, fortified milks, and eggs. These fats are also found in cold-water fish, such as sardines, salmon,  and mackerel. Limit how much you eat of: Canned or prepackaged foods. Food that is high in trans fat, such as some fried foods. Food that is high in saturated fat, such as fatty meat. Desserts and other sweets, sugary drinks, and other foods with added sugar. Full-fat dairy products. Do not salt foods before eating. Do not eat more than 4 egg yolks a week. Try to eat at least 2 vegetarian meals a week. Eat more home-cooked food and less restaurant, buffet, and fast food. Lifestyle When eating at a restaurant, ask that your food be prepared with less salt or no salt, if possible. If you drink alcohol: Limit how much you use to: 0-1 drink a day for women who are not pregnant. 0-2 drinks a day for men. Be aware of how much alcohol is in your drink. In the [place] one drink equals one 12 oz bottle of beer (355 mL), one 5 oz glass of wine (148 mL), or one 1 oz glass of hard liquor (44 mL). General information Avoid eating more than 2,300 mg of salt a day. If you have hypertension, you may need to reduce your sodium intake to 1,500 mg a day. Work with your health care provider to maintain a healthy body weight or to lose weight. Ask what an ideal weight is for you. Get at least 30 minutes of exercise that causes your heart to beat faster (aerobic exercise) most days of the week. Activities may include walking, swimming, or biking. Work with your health care provider or dietitian to   adjust your eating plan to your individual calorie needs. What foods should I eat? Fruits All fresh, dried, or frozen fruit. Canned fruit in natural juice (without added sugar). Vegetables Fresh or frozen vegetables (raw, steamed, roasted, or grilled). Low-sodium or reduced-sodium tomato and vegetable juice. Low-sodium or reduced-sodium tomato sauce and tomato paste. Low-sodium or reduced-sodium canned vegetables. Grains Whole-grain or whole-wheat bread. Whole-grain or whole-wheat pasta. Brown rice. Oatmeal. Quinoa.  Bulgur. Whole-grain and low-sodium cereals. Pita bread. Low-fat, low-sodium crackers. Whole-wheat flour tortillas. Meats and other proteins Skinless chicken or turkey. Ground chicken or turkey. Pork with fat trimmed off. Fish and seafood. Egg whites. Dried beans, peas, or lentils. Unsalted nuts, nut butters, and seeds. Unsalted canned beans. Lean cuts of beef with fat trimmed off. Low-sodium, lean precooked or cured meat, such as sausages or meat loaves. Dairy Low-fat (1%) or fat-free (skim) milk. Reduced-fat, low-fat, or fat-free cheeses. Nonfat, low-sodium ricotta or cottage cheese. Low-fat or nonfat yogurt. Low-fat, low-sodium cheese. Fats and oils Soft margarine without trans fats. Vegetable oil. Reduced-fat, low-fat, or light mayonnaise and salad dressings (reduced-sodium). Canola, safflower, olive, avocado, soybean, and sunflower oils. Avocado. Seasonings and condiments Herbs. Spices. Seasoning mixes without salt. Other foods Unsalted popcorn and pretzels. Fat-free sweets. The items listed above may not be a complete list of foods and beverages you can eat. Contact a dietitian for more information. What foods should I avoid? Fruits Canned fruit in a light or heavy syrup. Fried fruit. Fruit in cream or butter sauce. Vegetables Creamed or fried vegetables. Vegetables in a cheese sauce. Regular canned vegetables (not low-sodium or reduced-sodium). Regular canned tomato sauce and paste (not low-sodium or reduced-sodium). Regular tomato and vegetable juice (not low-sodium or reduced-sodium). Pickles. Olives. Grains Baked goods made with fat, such as croissants, muffins, or some breads. Dry pasta or rice meal packs. Meats and other proteins Fatty cuts of meat. Ribs. Fried meat. Bacon. Bologna, salami, and other precooked or cured meats, such as sausages or meat loaves. Fat from the back of a pig (fatback). Bratwurst. Salted nuts and seeds. Canned beans with added salt. Canned or smoked fish.  Whole eggs or egg yolks. Chicken or turkey with skin. Dairy Whole or 2% milk, cream, and half-and-half. Whole or full-fat cream cheese. Whole-fat or sweetened yogurt. Full-fat cheese. Nondairy creamers. Whipped toppings. Processed cheese and cheese spreads. Fats and oils Butter. Stick margarine. Lard. Shortening. Ghee. Bacon fat. Tropical oils, such as coconut, palm kernel, or palm oil. Seasonings and condiments Onion salt, garlic salt, seasoned salt, table salt, and sea salt. Worcestershire sauce. Tartar sauce. Barbecue sauce. Teriyaki sauce. Soy sauce, including reduced-sodium. Steak sauce. Canned and packaged gravies. Fish sauce. Oyster sauce. Cocktail sauce. Store-bought horseradish. Ketchup. Mustard. Meat flavorings and tenderizers. Bouillon cubes. Hot sauces. Pre-made or packaged marinades. Pre-made or packaged taco seasonings. Relishes. Regular salad dressings. Other foods Salted popcorn and pretzels. The items listed above may not be a complete list of foods and beverages you should avoid. Contact a dietitian for more information. Where to find more information National Heart, Lung, and Blood Institute: www.nhlbi.nih.gov American Heart Association: www.heart.org Academy of Nutrition and Dietetics: www.eatright.org National Kidney Foundation: www.kidney.org Summary The DASH eating plan is a healthy eating plan that has been shown to reduce high blood pressure (hypertension). It may also reduce your risk for type 2 diabetes, heart disease, and stroke. When on the DASH eating plan, aim to eat more fresh fruits and vegetables, whole grains, lean proteins, low-fat dairy, and heart-healthy fats. With the DASH   eating plan, you should limit salt (sodium) intake to 2,300 mg a day. If you have hypertension, you may need to reduce your sodium intake to 1,500 mg a day. Work with your health care provider or dietitian to adjust your eating plan to your individual calorie needs. This information is not  intended to replace advice given to you by your health care provider. Make sure you discuss any questions you have with your health care provider. Document Revised: 03/18/2019 Document Reviewed: 03/18/2019 Elsevier Patient Education  2022 Elsevier Inc. Low-Purine Eating Plan A low-purine eating plan involves making food choices to limit your intake of purine. Purine is a kind of uric acid. Too much uric acid in your blood can cause certain conditions, such as gout and kidney stones. Eating a low-purine diet can help control these conditions. What are tips for following this plan? Reading food labels Avoid foods with saturated or Trans fat. Check the ingredient list of grains-based foods, such as bread and cereal, to make sure that they contain whole grains. Check the ingredient list of sauces or soups to make sure they do not contain meat or fish. When choosing soft drinks, check the ingredient list to make sure they do not contain high-fructose corn syrup. Shopping  Buy plenty of fresh fruits and vegetables. Avoid buying canned or fresh fish. Buy dairy products labeled as low-fat or nonfat. Avoid buying premade or processed foods. These foods are often high in fat, salt (sodium), and added sugar. Cooking Use olive oil instead of butter when cooking. Oils like olive oil, canola oil, and sunflower oil contain healthy fats. Meal planning Learn which foods do or do not affect you. If you find out that a food tends to cause your gout symptoms to flare up, avoid eating that food. You can enjoy foods that do not cause problems. If you have any questions about a food item, talk with your dietitian or health care provider. Limit foods high in fat, especially saturated fat. Fat makes it harder for your body to get rid of uric acid. Choose foods that are lower in fat and are lean sources of protein. General guidelines Limit alcohol intake to no more than 1 drink a day for nonpregnant women and 2  drinks a day for men. One drink equals 12 oz of beer, 5 oz of wine, or 1 oz of hard liquor. Alcohol can affect the way your body gets rid of uric acid. Drink plenty of water to keep your urine clear or pale yellow. Fluids can help remove uric acid from your body. If directed by your health care provider, take a vitamin C supplement. Work with your health care provider and dietitian to develop a plan to achieve or maintain a healthy weight. Losing weight can help reduce uric acid in your blood. What foods are recommended? The items listed may not be a complete list. Talk with your dietitian about what dietary choices are best for you. Foods low in purines Foods low in purines do not need to be limited. These include: All fruits. All low-purine vegetables, pickles, and olives. Breads, pasta, rice, cornbread, and popcorn. Cake and other baked goods. All dairy foods. Eggs, nuts, and nut butters. Spices and condiments, such as salt, herbs, and vinegar. Plant oils, butter, and margarine. Water, sugar-free soft drinks, tea, coffee, and cocoa. Vegetable-based soups, broths, sauces, and gravies. Foods moderate in purines Foods moderate in purines should be limited to the amounts listed.  cup of asparagus, cauliflower, spinach, mushrooms,  or green peas, each day. 2/3 cup uncooked oatmeal, each day.  cup dry wheat bran or wheat germ, each day. 2-3 ounces of meat or poultry, each day. 4-6 ounces of shellfish, such as crab, lobster, oysters, or shrimp, each day. 1 cup cooked beans, peas, or lentils, each day. Soup, broths, or bouillon made from meat or fish. Limit these foods as much as possible. What foods are not recommended? The items listed may not be a complete list. Talk with your dietitian about what dietary choices are best for you. Limit your intake of foods high in purines, including: Beer and other alcohol. Meat-based gravy or sauce. Canned or fresh fish, such as: Anchovies,  sardines, herring, and tuna. Mussels and scallops. Codfish, trout, and haddock. Tomasa Blase. Organ meats, such as: Liver or kidney. Tripe. Sweetbreads (thymus gland or pancreas). Wild Education officer, environmental. Yeast or yeast extract supplements. Drinks sweetened with high-fructose corn syrup. Summary Eating a low-purine diet can help control conditions caused by too much uric acid in the body, such as gout or kidney stones. Choose low-purine foods, limit alcohol, and limit foods high in fat. You will learn over time which foods do or do not affect you. If you find out that a food tends to cause your gout symptoms to flare up, avoid eating that food. This information is not intended to replace advice given to you by your health care provider. Make sure you discuss any questions you have with your health care provider. Document Revised: 07/28/2019 Document Reviewed: 07/28/2019 Elsevier Patient Education  2022 Elsevier Inc. Gout Gout is painful swelling of your joints. Gout is a type of arthritis. It is caused by having too much uric acid in your body. Uric acid is a chemical that is made when your body breaks down substances called purines. If your body has too much uric acid, sharp crystals can form and build up in your joints. This causes pain and swelling. Gout attacks can happen quickly and be very painful (acute gout). Over time, the attacks can affect more joints and happen more often (chronic gout). What are the causes? Too much uric acid in your blood. This can happen because: Your kidneys do not remove enough uric acid from your blood. Your body makes too much uric acid. You eat too many foods that are high in purines. These foods include organ meats, some seafood, and beer. Trauma or stress. What increases the risk? Having a family history of gout. Being male and middle-aged. Being male and having gone through menopause. Being very overweight (obese). Drinking alcohol, especially beer. Not  having enough water in the body (being dehydrated). Losing weight too quickly. Having an organ transplant. Having lead poisoning. Taking certain medicines. Having kidney disease. Having a skin condition called psoriasis. What are the signs or symptoms? An attack of acute gout usually happens in just one joint. The most common place is the big toe. Attacks often start at night. Other joints that may be affected include joints of the feet, ankle, knee, fingers, wrist, or elbow. Symptoms of an attack may include: Very bad pain. Warmth. Swelling. Stiffness. Shiny, red, or purple skin. Tenderness. The affected joint may be very painful to touch. Chills and fever. Chronic gout may cause symptoms more often. More joints may be involved. You may also have white or yellow lumps (tophi) on your hands or feet or in other areas near your joints. How is this treated? Treatment for this condition has two phases: treating an acute attack  and preventing future attacks. Acute gout treatment may include: NSAIDs. Steroids. These are taken by mouth or injected into a joint. Colchicine. This medicine relieves pain and swelling. It can be given by mouth or through an IV tube. Preventive treatment may include: Taking small doses of NSAIDs or colchicine daily. Using a medicine that reduces uric acid levels in your blood. Making changes to your diet. You may need to see a food expert (dietitian) about what to eat and drink to prevent gout. Follow these instructions at home: During a gout attack  If told, put ice on the painful area: Put ice in a plastic bag. Place a towel between your skin and the bag. Leave the ice on for 20 minutes, 2-3 times a day. Raise (elevate) the painful joint above the level of your heart as often as you can. Rest the joint as much as possible. If the joint is in your leg, you may be given crutches. Follow instructions from your doctor about what you cannot eat or  drink. Avoiding future gout attacks Eat a low-purine diet. Avoid foods and drinks such as: Liver. Kidney. Anchovies. Asparagus. Herring. Mushrooms. Mussels. Beer. Stay at a healthy weight. If you want to lose weight, talk with your doctor. Do not lose weight too fast. Start or continue an exercise plan as told by your doctor. Eating and drinking Drink enough fluids to keep your pee (urine) pale yellow. If you drink alcohol: Limit how much you use to: 0-1 drink a day for women. 0-2 drinks a day for men. Be aware of how much alcohol is in your drink. In the [place] one drink equals one 12 oz bottle of beer (355 mL), one 5 oz glass of wine (148 mL), or one 1 oz glass of hard liquor (44 mL). General instructions Take over-the-counter and prescription medicines only as told by your doctor. Do not drive or use heavy machinery while taking prescription pain medicine. Return to your normal activities as told by your doctor. Ask your doctor what activities are safe for you. Keep all follow-up visits as told by your doctor. This is important. Contact a doctor if: You have another gout attack. You still have symptoms of a gout attack after 10 days of treatment. You have problems (side effects) because of your medicines. You have chills or a fever. You have burning pain when you pee (urinate). You have pain in your lower back or belly. Get help right away if: You have very bad pain. Your pain cannot be controlled. You cannot pee. Summary Gout is painful swelling of the joints. The most common site of pain is the big toe, but it can affect other joints. Medicines and avoiding some foods can help to prevent and treat gout attacks. This information is not intended to replace advice given to you by your health care provider. Make sure you discuss any questions you have with your health care provider. Document Revised: 10/23/2017 Document Reviewed: 11/04/2017 Elsevier Patient Education  2022  [employer].

## 2021-03-15 ENCOUNTER — Other Ambulatory Visit: Payer: Self-pay

## 2021-03-15 ENCOUNTER — Emergency Department (HOSPITAL_COMMUNITY)
Admission: EM | Admit: 2021-03-15 | Discharge: 2021-03-15 | Disposition: A | Discharge status: Home / Self Care | Payer: Commercial Managed Care - PPO | Attending: Emergency Medicine | Admitting: Emergency Medicine

## 2021-03-15 ENCOUNTER — Encounter (HOSPITAL_COMMUNITY): Payer: Self-pay | Admitting: *Deleted

## 2021-03-15 ENCOUNTER — Telehealth: Payer: Self-pay | Admitting: Family Medicine

## 2021-03-15 DIAGNOSIS — R220 Localized swelling, mass and lump, head: Secondary | ICD-10-CM | POA: Diagnosis present

## 2021-03-15 DIAGNOSIS — L089 Local infection of the skin and subcutaneous tissue, unspecified: Secondary | ICD-10-CM | POA: Diagnosis not present

## 2021-03-15 DIAGNOSIS — K047 Periapical abscess without sinus: Secondary | ICD-10-CM | POA: Insufficient documentation

## 2021-03-15 DIAGNOSIS — I10 Essential (primary) hypertension: Secondary | ICD-10-CM | POA: Diagnosis not present

## 2021-03-15 MED ORDER — AMOXICILLIN-POT CLAVULANATE 875-125 MG PO TABS: 1.0000 | ORAL_TABLET | Freq: Two times a day (BID) | ORAL | 0 refills | Status: AC

## 2021-03-15 MED ORDER — AMOXICILLIN-POT CLAVULANATE 875-125 MG PO TABS
1.0000 | ORAL_TABLET | Freq: Once | ORAL | Status: AC
  Administered 2021-03-15: 1 via ORAL
  Filled 2021-03-15: qty 1

## 2021-03-15 NOTE — ED Provider Notes (Signed)
[facility] EMERGENCY DEPARTMENT Provider Note   CSN: [id number] Arrival date & time: 03/15/21  1234     History Chief Complaint  Patient presents with   Facial Swelling    Marvin Rivera is a 30 y.o. male presenting to the ED with facial swelling and pain.  Patient reports he developed a dental abscess in the left upper molar region a few days ago.  He was started on penicillin 3 days ago and has been taking this regularly.  He feels that the swelling is spread up into his left cheek below his left eye.  He denies any loss of vision or pain with eye movement.  He denies any fevers or chills.  However he does report that there is some pain around his left maxilla and he feels the infection is getting worse.  He has a dentist appointment coming up in 3 days.  HPI     Past Medical History:  Diagnosis Date   Hypertension    Liver cyst 2009    Patient Active Problem List   Diagnosis Date Noted   Chest pain 10/06/2020   Chronic cholecystitis with calculus 10/06/2020   Hypertension    Class 3 obesity     Hyperglycemia    Gastroesophageal reflux disease without esophagitis 09/13/2017    History reviewed. No pertinent surgical history.     Family History  Problem Relation Age of Onset   Lung cancer Mother    Atrial fibrillation Mother    Hypertension Mother    Atrial fibrillation Father    Sleep apnea Brother    Heart disease Paternal Grandmother    Other Paternal Grandmother        carney complex   Lung cancer Paternal Grandfather     Social History   Tobacco Use   Smoking status: Never   Smokeless tobacco: Never  Vaping Use   Vaping Use: Never used  Substance Use Topics   Alcohol use: Yes    Comment: rarely   Drug use: No    Home Medications Prior to Admission medications   Medication Sig Start Date End Date Taking? Authorizing Provider  amoxicillin-clavulanate (AUGMENTIN) 875-125 MG tablet Take 1 tablet by mouth every 12 (twelve) hours for 7 days.  03/16/21 03/23/21 Yes Charlissa Petros, Kermit Balo, MD  allopurinol (ZYLOPRIM) 100 MG tablet Take 1 tablet (100 mg total) by mouth daily. 03/13/21   Babs Sciara, MD  colchicine 0.6 MG tablet Take 1 tablet (0.6 mg total) by mouth daily. 03/13/21   Babs Sciara, MD  penicillin v potassium (VEETID) 500 MG tablet Take 1 tablet (500 mg total) by mouth 4 (four) times daily for 7 days. 03/13/21 03/20/21  Babs Sciara, MD    Allergies    Bee venom  Review of Systems   Review of Systems  Constitutional:  Negative for chills and fever.  HENT:  Positive for dental problem and facial swelling. Negative for ear pain.   Eyes:  Negative for photophobia, pain, discharge, redness, itching and visual disturbance.  Respiratory:  Negative for cough and shortness of breath.   Cardiovascular:  Negative for chest pain and palpitations.  Skin:  Positive for rash. Negative for wound.  Neurological:  Negative for syncope and headaches.  All other systems reviewed and are negative.  Physical Exam Updated Vital Signs BP (!) 167/94   Pulse 95   Temp 99 F (37.2 C) (Oral)   Resp 18   Ht 5\' 11"  (1.803 m)   Wt )  158.8 kg   SpO2 96%   BMI 48.82 kg/m   Physical Exam Constitutional:      General: He is not in acute distress. HENT:     Head: Normocephalic and atraumatic.     Mouth/Throat:     Comments: Poor dentitia, cavity left upper molar, no fluctuant pocket or abscess on exam Tenderness and erythema of left maxilla and mild edema left lower periorbital region No pain with EOM, no vision changes Eyes:     Conjunctiva/sclera: Conjunctivae normal.     Pupils: Pupils are equal, round, and reactive to light.  Cardiovascular:     Rate and Rhythm: Normal rate and regular rhythm.     Pulses: Normal pulses.  Pulmonary:     Effort: Pulmonary effort is normal. No respiratory distress.  Skin:    General: Skin is warm and dry.  Neurological:     General: No focal deficit present.     Mental Status: He is  alert. Mental status is at baseline.  Psychiatric:        Mood and Affect: Mood normal.        Behavior: Behavior normal.    ED Results / Procedures / Treatments   Labs (all labs ordered are listed, but only abnormal results are displayed) Labs Reviewed - No data to display  EKG None  Radiology No results found.  Procedures Procedures   Medications Ordered in ED Medications  amoxicillin-clavulanate (AUGMENTIN) 875-125 MG per tablet 1 tablet (1 tablet Oral Given 03/15/21 1541)    ED Course  I have reviewed the triage vital signs and the nursing notes.  Pertinent labs & imaging results that were available during my care of the patient were reviewed by me and considered in my medical decision making (see chart for details).  Patient is here with worsening dental infection.  I suspect the infection to spread up into the cheek.  He does not appear septic.  I doubt Ludewig's angina.  Did not feel a drainable abscess.  There is evidence of orbital cellulitis.  I would broaden his coverage and encouraged him to continue the penicillin but we will also start him on Augmentin as well.  He verbalized understanding.    Final Clinical Impression(s) / ED Diagnoses Final diagnoses:  Dental abscess  Facial infection    Rx / DC Orders ED Discharge Orders          Ordered    amoxicillin-clavulanate (AUGMENTIN) 875-125 MG tablet  Every 12 hours        03/15/21 1613             Wyvonnia Dusky, MD 03/16/21 [phone]

## 2021-03-15 NOTE — Telephone Encounter (Signed)
Unfortunately I do not know of any Dentist that would do this on an emergency basis.  Most dentists do have the ability to answer on call questions-such as a after-hours call.  Doing the antibiotics along with warm compresses would be a good choice at this point. A dentist may be able to help guide the situation better if they are able to get a hold one. Certainly if his situation gets dire over the weekend going to the emergency department would be reasonable

## 2021-03-15 NOTE — Discharge Instructions (Addendum)
Continue taking your penicillin and start taking Augmentin as well.  Please follow-up with your dentist this week as scheduled.  Your next dose of Augmentin is due tomorrow morning with breakfast

## 2021-03-15 NOTE — Telephone Encounter (Signed)
Please advise. Thank you

## 2021-03-15 NOTE — Telephone Encounter (Signed)
Pt wife (DPR) contacted and verbalized understanding.  

## 2021-03-15 NOTE — Telephone Encounter (Signed)
Patients wife stated that patient has been having some issues with a tooth and has been taking antibiotics but this morning woke up and his face was swollen. She believes it is abscessed. Their dentist is not open today and she has been calling around for an emergency dentist but cannot find one.  He has an appointment to get it pulled Tuesday but she is worried about getting through the weekend. Wanted to know if Dr. Lorin Picket could recommend and emergency dentist where he can get it pulled today. Please advise.  CB#  [phone]

## 2021-03-15 NOTE — ED Triage Notes (Signed)
Pt with left facial swelling since last night.  Started penicillin on Wednesday for dental pain. C/o left eye pain now.

## 2021-04-03 ENCOUNTER — Telehealth: Payer: Self-pay | Admitting: Neurology

## 2021-04-03 NOTE — Telephone Encounter (Signed)
Pt's wife called asking if there is another DME company to use since the machine is still on back order. Pt's wife requesting a call back.

## 2021-04-03 NOTE — Telephone Encounter (Addendum)
Spoke with patient's wife and we discussed sending the order over to [employer].  She was amenable and said she would let the patient know.  I gave her airflows number.  I advised I would send the order to [employer] and patient should receive a welcome call in approximately 48 hours.  She was very appreciative and her questions were answered.  She is aware they do not have a local office but they do have an RT that can come into the home to assist with mask fit and making sure everything is set up properly.  Order sent securely to [facility] w/ [employer]. Withdrew request from [employer] as well.

## 2021-04-04 NOTE — Telephone Encounter (Signed)
[employer] rep has confirmed and will process order. Anticipate possible [employer]ree[employer] Resvent machine.

## 2021-04-08 ENCOUNTER — Telehealth: Payer: Self-pay | Admitting: *Deleted

## 2021-04-08 NOTE — Telephone Encounter (Signed)
This patient has a pending autoPAP setup date of 04/10/2021. Please call patient or his wife (on [place]) to setup an initial f/u appt between 05/10/2020 and 07/09/2020 and make sure patient is told to bring his machine & power cord to the first appointment. Thank you!

## 2021-05-02 ENCOUNTER — Ambulatory Visit: Payer: Commercial Managed Care - PPO | Admitting: Neurology

## 2021-05-12 ENCOUNTER — Encounter (HOSPITAL_COMMUNITY): Payer: Self-pay | Admitting: Emergency Medicine

## 2021-05-12 ENCOUNTER — Emergency Department (HOSPITAL_COMMUNITY)
Admission: EM | Admit: 2021-05-12 | Discharge: 2021-05-12 | Disposition: A | Discharge status: Home / Self Care | Payer: Commercial Managed Care - PPO | Attending: Emergency Medicine | Admitting: Emergency Medicine

## 2021-05-12 ENCOUNTER — Other Ambulatory Visit: Payer: Self-pay

## 2021-05-12 ENCOUNTER — Emergency Department (HOSPITAL_COMMUNITY): Payer: Commercial Managed Care - PPO

## 2021-05-12 DIAGNOSIS — K6389 Other specified diseases of intestine: Secondary | ICD-10-CM | POA: Insufficient documentation

## 2021-05-12 DIAGNOSIS — I10 Essential (primary) hypertension: Secondary | ICD-10-CM | POA: Insufficient documentation

## 2021-05-12 DIAGNOSIS — R1032 Left lower quadrant pain: Secondary | ICD-10-CM | POA: Diagnosis present

## 2021-05-12 DIAGNOSIS — Z20822 Contact with and (suspected) exposure to covid-19: Secondary | ICD-10-CM | POA: Diagnosis not present

## 2021-05-12 DIAGNOSIS — K760 Fatty (change of) liver, not elsewhere classified: Secondary | ICD-10-CM | POA: Insufficient documentation

## 2021-05-12 HISTORY — DX: Calculus of gallbladder without cholecystitis without obstruction: K80.20

## 2021-05-12 LAB — CBC WITH DIFFERENTIAL/PLATELET
Abs Immature Granulocytes: 0.04 10*3/uL (ref 0.00–0.07)
Basophils Absolute: 0.1 10*3/uL (ref 0.0–0.1)
Basophils Relative: 1 %
Eosinophils Absolute: 0.2 10*3/uL (ref 0.0–0.5)
Eosinophils Relative: 2 %
HCT: 42.1 % (ref 39.0–52.0)
Hemoglobin: 13.7 g/dL (ref 13.0–17.0)
Immature Granulocytes: 0 %
Lymphocytes Relative: 31 %
Lymphs Abs: 3 10*3/uL (ref 0.7–4.0)
MCH: 29.6 pg (ref 26.0–34.0)
MCHC: 32.5 g/dL (ref 30.0–36.0)
MCV: 90.9 fL (ref 80.0–100.0)
Monocytes Absolute: 0.8 10*3/uL (ref 0.1–1.0)
Monocytes Relative: 8 %
Neutro Abs: 5.6 10*3/uL (ref 1.7–7.7)
Neutrophils Relative %: 58 %
Platelets: 307 10*3/uL (ref 150–400)
RBC: 4.63 MIL/uL (ref 4.22–5.81)
RDW: 13.2 % (ref 11.5–15.5)
WBC: 9.7 10*3/uL (ref 4.0–10.5)
nRBC: 0 % (ref 0.0–0.2)

## 2021-05-12 LAB — URINALYSIS, ROUTINE W REFLEX MICROSCOPIC
Bilirubin Urine: NEGATIVE
Glucose, UA: NEGATIVE mg/dL
Hgb urine dipstick: NEGATIVE
Ketones, ur: NEGATIVE mg/dL
Leukocytes,Ua: NEGATIVE
Nitrite: NEGATIVE
Protein, ur: NEGATIVE mg/dL
Specific Gravity, Urine: >1.03 — ABNORMAL HIGH (ref 1.005–1.030)
pH: 6 (ref 5.0–8.0)

## 2021-05-12 LAB — COMPREHENSIVE METABOLIC PANEL
ALT: 39 U/L (ref 0–44)
AST: 29 U/L (ref 15–41)
Albumin: 4.3 g/dL (ref 3.5–5.0)
Alkaline Phosphatase: 55 U/L (ref 38–126)
Anion gap: 7 (ref 5–15)
BUN: 13 mg/dL (ref 6–20)
CO2: 26 mmol/L (ref 22–32)
Calcium: 9.1 mg/dL (ref 8.9–10.3)
Chloride: 104 mmol/L (ref 98–111)
Creatinine, Ser: 1.01 mg/dL (ref 0.61–1.24)
GFR, Estimated: >60 mL/min (ref >60)
Glucose, Bld: 99 mg/dL (ref 70–99)
Potassium: 4 mmol/L (ref 3.5–5.1)
Sodium: 137 mmol/L (ref 135–145)
Total Bilirubin: 1 mg/dL (ref 0.3–1.2)
Total Protein: 7.5 g/dL (ref 6.5–8.1)

## 2021-05-12 LAB — RESP PANEL BY RT-PCR (FLU A&B, COVID) ARPGX2
Influenza A by PCR: NEGATIVE
Influenza B by PCR: NEGATIVE
SARS Coronavirus 2 by RT PCR: NEGATIVE

## 2021-05-12 LAB — LIPASE, BLOOD: Lipase: 47 U/L (ref 11–51)

## 2021-05-12 MED ORDER — OXYCODONE HCL 5 MG PO TABS
5.0000 mg | ORAL_TABLET | ORAL | 0 refills | Status: DC | PRN
Start: 2021-05-12 — End: 2022-09-19

## 2021-05-12 MED ORDER — KETOROLAC TROMETHAMINE 15 MG/ML IJ SOLN
15.0000 mg | Freq: Once | INTRAMUSCULAR | Status: AC
Start: 2021-05-12 — End: 2021-05-12
  Administered 2021-05-12: 15 mg via INTRAVENOUS
  Filled 2021-05-12: qty 1

## 2021-05-12 MED ORDER — IOHEXOL 300 MG/ML  SOLN
100.0000 mL | Freq: Once | INTRAMUSCULAR | Status: AC | PRN
  Administered 2021-05-12: 100 mL via INTRAVENOUS

## 2021-05-12 NOTE — ED Provider Notes (Signed)
[facility] EMERGENCY DEPARTMENT Provider Note   CSN: [id number] Arrival date & time: 05/12/21  6283     History  Chief Complaint  Patient presents with   Abdominal Pain    Marvin Rivera is a 31 y.o. male who presents with concern for 48 hours of progressively worsening left lower abdominal pain that started in the afternoon.  Initially, achy, felt like a pulled muscle but the pain has been progressively worsening.  It is worse immediately prior to bowel movement and is minimally relieved with a bowel movement.  No melena or hematochezia.  No diarrhea.  No changes in his urinary habits and no flank pain.  No fevers or chills, no nausea or vomiting.  No history of the same.  I personally reviewed this patient's medical records.  He has chronic cholecystitis with calculus that was managed nonoperatively.  GERD, hypertension, and obesity.  He is on allopurinol daily for gout.  HPI     Home Medications Prior to Admission medications   Medication Sig Start Date End Date Taking? Authorizing Provider  allopurinol (ZYLOPRIM) 100 MG tablet Take 1 tablet (100 mg total) by mouth daily. 03/13/21   Marvin Sciara, MD  colchicine 0.6 MG tablet Take 1 tablet (0.6 mg total) by mouth daily. 03/13/21   Marvin Sciara, MD      Allergies    Bee venom    Review of Systems   Review of Systems  Constitutional: Negative.   HENT: Negative.    Respiratory: Negative.    Cardiovascular: Negative.   Gastrointestinal:  Positive for abdominal pain. Negative for constipation, diarrhea, nausea and vomiting.  Genitourinary: Negative.   Musculoskeletal: Negative.   Skin: Negative.   Neurological: Negative.   Hematological: Negative.    Physical Exam Updated Vital Signs BP 132/65    Pulse 78    Temp 99 F (37.2 C) (Oral)    Resp 16    Ht 5\' 10"  (1.778 m)    Wt (!) 158.8 kg    SpO2 97%    BMI 50.22 kg/m  Physical Exam Vitals and nursing note reviewed.  Constitutional:      Appearance: He is  obese. He is not ill-appearing or toxic-appearing.  HENT:     Head: Normocephalic and atraumatic.     Mouth/Throat:     Mouth: Mucous membranes are moist.     Pharynx: No oropharyngeal exudate or posterior oropharyngeal erythema.  Eyes:     General:        Right eye: No discharge.        Left eye: No discharge.     Extraocular Movements: Extraocular movements intact.     Conjunctiva/sclera: Conjunctivae normal.     Pupils: Pupils are equal, round, and reactive to light.  Cardiovascular:     Rate and Rhythm: Normal rate and regular rhythm.     Pulses: Normal pulses.     Heart sounds: Normal heart sounds.  Pulmonary:     Effort: Pulmonary effort is normal. No respiratory distress.     Breath sounds: Normal breath sounds. No wheezing or rales.  Abdominal:     General: Bowel sounds are normal. There is no distension.     Palpations: Abdomen is soft.     Tenderness: There is abdominal tenderness in the left lower quadrant. There is rebound. There is no right CVA tenderness, left CVA tenderness or guarding. Negative signs include Murphy's sign and McBurney's sign.     Hernia: No hernia is present.  Musculoskeletal:        General: No deformity.     Cervical back: Neck supple.  Skin:    General: Skin is warm and dry.     Capillary Refill: Capillary refill takes less than 2 seconds.  Neurological:     General: No focal deficit present.     Mental Status: He is alert and oriented to person, place, and time. Mental status is at baseline.  Psychiatric:        Mood and Affect: Mood normal.    ED Results / Procedures / Treatments   Labs (all labs ordered are listed, but only abnormal results are displayed) Labs Reviewed  RESP PANEL BY RT-PCR (FLU A&B, COVID) ARPGX2  COMPREHENSIVE METABOLIC PANEL  CBC WITH DIFFERENTIAL/PLATELET  LIPASE, BLOOD  URINALYSIS, ROUTINE W REFLEX MICROSCOPIC    EKG None  Radiology No results found.  Procedures Procedures    Medications Ordered in  ED Medications - No data to display  ED Course/ Medical Decision Making/ A&P                           Medical Decision Making 31 year old male with left lower abdominal pain. Differential diagnosis includes but is limited to diverticulitis, mesenteric ischemia, appendicitis, epiploic appendagitis, torsion, volvulus, internal hernia.  Patient mildly upper tensive on intake, vital signs otherwise normal.  Cardiopulmonary exam is normal, abdominal exam is benign.  Patient with left lower quadrant tenderness palpation without rebound, guarding, or skin changes.   Amount and/or Complexity of Data Reviewed Labs: ordered.    Details: CBC without leukocytosis or anemia, CMP and lipase are normal.  UA unremarkable. Radiology: ordered.    Details: CT abdomen pelvis revealed epiploic appendagitis without other acute findings in the abdomen or pelvis.  No diverticulitis.  Risk Prescription drug management.  Given reassuring physical exam and CT scan, no further work-up is warranted near at this time.  Patient may follow-up with his primary care doctor and take over the counter analgesia as needed.  Reyden voiced understanding with medical evaluation and treatment plan.  He was questions answered to his expressed satisfaction.  Return precautions were given.  Patient is well-appearing, stable, and was discharged in good condition.  This chart was dictated using voice recognition software, Dragon. Despite the best efforts of this provider to proofread and correct errors, errors may still occur which can change documentation meaning.    Final Clinical Impression(s) / ED Diagnoses Final diagnoses:  None    Rx / DC Orders ED Discharge Orders     None         Marvin Rivera 05/14/21 1013    Marvin Files, MD 05/14/21 1110

## 2021-05-12 NOTE — ED Notes (Signed)
Pt d/c home per MD order. Discharge summary reviewed, verbalize understanding. Ambulatory off unit. Discharged home with spouse. No s/s of acute distress noted at discharge.

## 2021-05-12 NOTE — Discharge Instructions (Addendum)
You are seen here today for your belly pain.  You are diagnosed with epiploic appendagitis.  This is not an infection.  You may use Tylenol and ibuprofen as needed alternating every 3 hours for your pain.  You have also been prescribed a stronger pain medication which you may take as needed in the evenings for more severe pain.  Please follow with your primary care doctor and return to ER for develop any fever, chills, nausea or vomiting that does not stop, worsening abdominal pain after few days, or any other new severe symptoms

## 2021-05-12 NOTE — ED Triage Notes (Signed)
Patient c/o left lower abd pain that started Friday afternoon. Per patient originally thought pulled muscle but pain is increasingly getting worse. Denies any nausea, vomiting, or diarrhea. Patient states pain is worse before BM. Denies any blood in stool or urine.

## 2021-05-12 NOTE — ED Notes (Signed)
Patient transported to CT 

## 2021-06-12 LAB — URIC ACID: Uric Acid: 6 mg/dL (ref 3.8–8.4)

## 2021-06-13 ENCOUNTER — Telehealth: Payer: Self-pay | Admitting: Family Medicine

## 2021-06-13 ENCOUNTER — Other Ambulatory Visit: Payer: Self-pay

## 2021-06-13 ENCOUNTER — Ambulatory Visit: Payer: Commercial Managed Care - PPO | Admitting: Family Medicine

## 2021-06-13 VITALS — BP 138/72 | HR 72 | Temp 98.9°F | Ht 70.0 in | Wt 370.6 lb

## 2021-06-13 DIAGNOSIS — R030 Elevated blood-pressure reading, without diagnosis of hypertension: Secondary | ICD-10-CM

## 2021-06-13 DIAGNOSIS — Z1329 Encounter for screening for other suspected endocrine disorder: Secondary | ICD-10-CM

## 2021-06-13 DIAGNOSIS — E785 Hyperlipidemia, unspecified: Secondary | ICD-10-CM | POA: Diagnosis not present

## 2021-06-13 DIAGNOSIS — R252 Cramp and spasm: Secondary | ICD-10-CM

## 2021-06-13 NOTE — Telephone Encounter (Signed)
Patient requesting for bills dated for 2022 to be sent to insurance.   [employer] member id: [id number] Group # [id number]  Copy of insurance card in documents.  Please advise at [phone].

## 2021-06-13 NOTE — Progress Notes (Signed)
° °  Subjective:    Patient ID: Marvin Rivera, male    DOB: 03-06-1991, 31 y.o.   MRN: [id number]  HPI Patient here for blood pressure check.  No dizziness or headaches. Presents today for follow-up Denies any chest tightness pressure pain shortness of breath He was trying to watch his diet and do some walking but over the past month he has not been able to do so and states his diet has not been as good  He relates he has been having cramping in his hands pain discomfort.  Hurts when he does certain things.  Denies joint pain or swelling.  It is more so when he has to grip on a persistent basis Review of Systems     Objective:   Physical Exam General-in no acute distress Eyes-no discharge Lungs-respiratory rate normal, CTA CV-no murmurs,RRR Extremities skin warm dry no edema Neuro grossly normal Behavior normal, alert        Assessment & Plan:   Borderline blood pressure Dietary measures discussed Healthy eating DASH diet also fit in walking on a regular basis  Muscle cramps in the hands-patient relates frequent muscle cramps recommend treating with rest when possible.  Also recommend staying well-hydrated  Do labs Follow-up within 6 months

## 2021-06-13 NOTE — Telephone Encounter (Signed)
I have reviewed patients outstanding balance and none are from 2022.  Please see screen shot below the dates of service is from 2019. I have left msg for patient to return my call so I can advise.

## 2021-06-13 NOTE — Patient Instructions (Signed)
Please do the best he can with healthy eating.  In addition to this try to fit in some walking on a regular basis.  When you do your lab work we will let you know the results  I believe it would be a good idea for you to follow-up in approximately 6 months so we can recheck your blood pressure  If you are having any problems or issues before then we will be happy to see you.  TakeCare-Dr. Nicki Reaper

## 2021-06-14 NOTE — Telephone Encounter (Signed)
Outstanding balances from 2022 are on screen shot below from [place] 05/15/20 ans 01/07/21.   Thank you.

## 2021-07-03 ENCOUNTER — Ambulatory Visit: Payer: Commercial Managed Care - PPO | Admitting: Neurology

## 2021-07-03 ENCOUNTER — Encounter: Payer: Self-pay | Admitting: Neurology

## 2021-07-03 VITALS — BP 156/81 | HR 75 | Ht 71.0 in | Wt 376.0 lb

## 2021-07-03 DIAGNOSIS — G4733 Obstructive sleep apnea (adult) (pediatric): Secondary | ICD-10-CM

## 2021-07-03 DIAGNOSIS — Z9989 Dependence on other enabling machines and devices: Secondary | ICD-10-CM | POA: Diagnosis not present

## 2021-07-03 NOTE — Patient Instructions (Addendum)
You are compliant with your AutoPap.  Keep up the good work. You can follow-up to see one of our nurse practitioners in 1 year.  Please continue to work on weight loss. ?Please continue using your autoPAP regularly. While your insurance requires that you use PAP at least 4 hours each night on 70% of the nights, I recommend, that you not skip any nights and use it throughout the night if you can. Getting used to PAP and staying with the treatment long term does take time and patience and discipline. Untreated obstructive sleep apnea when it is moderate to severe can have an adverse impact on cardiovascular health and raise her risk for heart disease, arrhythmias, hypertension, congestive heart failure, stroke and diabetes. Untreated obstructive sleep apnea causes sleep disruption, nonrestorative sleep, and sleep deprivation. This can have an impact on your day to day functioning and cause daytime sleepiness and impairment of cognitive function, memory loss, mood disturbance, and problems focussing. Using PAP regularly can improve these symptoms. ? ? ? ?

## 2021-07-03 NOTE — Progress Notes (Signed)
Subjective:    Patient ID: Marvin Rivera is a 31 y.o. male.  HPI    Interim history:   Marvin Rivera is a 31 year old right-handed gentleman with an underlying medical history of cholecystitis, chest pain, hypertension, liver cyst, and severe obesity with a BMI of over 74, who Presents for follow-up consultation of his obstructive sleep apnea after interim home sleep testing and starting AutoPap therapy.  The patient is unaccompanied today.  I first met him at the request of his primary care physician on 12/12/2020, at which time he reported snoring, witnessed apneas as well as excessive daytime somnolence.  He was advised to proceed with a sleep study.  He had a home sleep test on 02/04/2021 which indicated moderate obstructive sleep apnea with a total AHI of 15.2/hour and O2 nadir of 85%.  Snoring was detected in the mild-to-moderate range, at times louder.  He was advised to start AutoPap therapy.  His set up date was 04/05/2021.  He has a [employer]Med air sense 10 machine.  Today, 07/03/2021: I reviewed his AutoPap compliance data from 06/02/2021 through 07/01/2021, which is a total of 30 days, during which time he used his machine every night with percent use days greater than 4 hours at 87%, indicating very good compliance with an average usage of 6 hours and 19 minutes, residual AHI at goal at 0.6/h, average pressure for the 95th percentile at 11.1 cm with a range of 6 to 18 cm, leak on the higher side with the 95th percentile at 29.1 L/min.  He reports feeling better.  He has better daytime energy and less tiredness.  He has had some weight gain, probably in part due to stress, he believes. His wife is noted that he no longer snores but he has had some leak from the mask.  He uses a fullface mask, of note, he also has a full beard.  Of note, his DME company is [employer].  I had requested his AutoPap range to be 6 to 12 cm, not sure why they placed him on maximum pressure of 18 cm, maximum pressure for this  past month was 12.3 cm.  He reports that sleep is sometimes interrupted because his 30-year-old does not sleep well.  He has a 33 year old and a 32-year-old as well.  His first machine was defective and he got a new machine in January 2023.  He has plenty of supplies and knows to change the filter monthly.  He had interim emergency room visits in November 2022 for acute gout and dental abscess, and an emergency room visit in January 2023 for abdominal pain.  He was found to have epiploic appendagitis.   The patient's allergies, current medications, family history, past medical history, past social history, past surgical history and problem list were reviewed and updated as appropriate.   Previously:   12/12/20: (He) reports snoring and excessive daytime somnolence as well as pauses in his breathing while asleep.  I reviewed your office note from 10/08/2020.  His Epworth sleepiness score is 15 out of 24, fatigue severity score is 48 out of 63.  He has woken up with a headache, he has fairly frequent morning headaches, describes the headaches as bifrontal, dull and aching, not severe enough to take medication.  His brother has recently been diagnosed with sleep apnea.  Patient reports that when he was 20, he had a fractured nose and never had corrected.  He has trouble breathing through his nose, 1 nostril always seems to be  stopped out but it could be either side.  Weight has been fluctuating.  He lives with his family including wife and 3 children, ages 36, 42 and 2.  His 64-year-old is not a good sleeper.  He has a bedtime of around 1 AM, has trouble going to sleep at night, he had trouble going to sleep at night when he was a child.  Rise time is around 7:30 AM.  He does not have night to night nocturia.  He drinks caffeine in the form of soda, typically a 20 ounce serving per day, quit smoking in the distant past, did not smoke for long period he drinks alcohol rarely.  They have 1 dog in the household.  He  does have a TV in the bedroom but does not typically watch it there.  He works in Estate manager/land agent.    His Past Medical History Is Significant For: Past Medical History:  Diagnosis Date   Gallstones    Hypertension    Liver cyst 2009    His Past Surgical History Is Significant For: History reviewed. No pertinent surgical history.  His Family History Is Significant For: Family History  Problem Relation Age of Onset   Lung cancer Mother    Atrial fibrillation Mother    Hypertension Mother    Atrial fibrillation Father    Sleep apnea Brother    Heart disease Paternal Grandmother    Other Paternal Grandmother        carney complex   Lung cancer Paternal Grandfather     His Social History Is Significant For: Social History   Socioeconomic History   Marital status: Married    Spouse name: Not on file   Number of children: 3   Years of education: Not on file   Highest education level: Not on file  Occupational History   Not on file  Tobacco Use   Smoking status: Never   Smokeless tobacco: Never  Vaping Use   Vaping Use: Never used  Substance and Sexual Activity   Alcohol use: Yes    Comment: rarely   Drug use: No   Sexual activity: Not on file  Other Topics Concern   Not on file  Social History Narrative   Not on file   Social Determinants of Health   Financial Resource Strain: Not on file  Food Insecurity: Not on file  Transportation Needs: Not on file  Physical Activity: Not on file  Stress: Not on file  Social Connections: Not on file    His Allergies Are:  Allergies  Allergen Reactions   Bee Venom     yellowjackets  :   His Current Medications Are:  Outpatient Encounter Medications as of 07/03/2021  Medication Sig   allopurinol (ZYLOPRIM) 100 MG tablet Take 1 tablet (100 mg total) by mouth daily.   colchicine 0.6 MG tablet Take 1 tablet (0.6 mg total) by mouth daily.   oxyCODONE (ROXICODONE) 5 MG immediate release tablet Take 1 tablet (5 mg total)  by mouth every 4 (four) hours as needed for severe pain. (Patient not taking: Reported on 07/03/2021)   No facility-administered encounter medications on file as of 07/03/2021.  :  Review of Systems:  Out of a complete 14 point review of systems, all are reviewed and negative with the exception of these symptoms as listed below:  Review of Systems  Neurological:        Pt is here for CPAP follow up . Pt states he has no  questions or concerns for this visit    Objective:  Neurological Exam  Physical Exam Physical Examination:   Vitals:   07/03/21 1408  BP: (!) 156/81  Pulse: 75   General Examination: The patient is a very pleasant 31 y.o. male in no acute distress. He appears well-developed and well-nourished and well groomed.   HEENT: Normocephalic, atraumatic, pupils are equal, round and reactive to light, extraocular tracking is well-preserved, corrective eyeglasses in place, speech is clear, face is symmetric with normal facial animation.   Neck is supple with full range of passive and active motion. There are no carotid bruits on auscultation. Oropharynx exam reveals: mild mouth dryness, good dental hygiene and moderate airway crowding.  Nasal inspection reveals mildly deformed nose from the outside, no significant deviated septum but narrow nasal passages bilaterally. Tongue protrudes centrally and palate elevates symmetrically.     Chest: Clear to auscultation without wheezing, rhonchi or crackles noted.   Heart: S1+S2+0, regular and normal without murmurs, rubs or gallops noted.    Abdomen: Soft, non-tender and non-distended.   Extremities: There is no pitting edema in the distal lower extremities bilaterally.    Skin: Warm and dry without trophic changes noted.    Musculoskeletal: exam reveals no obvious joint deformities.    Neurologically:  Mental status: The patient is awake, alert and oriented in all 4 spheres. His immediate and remote memory, attention, language  skills and fund of knowledge are appropriate. There is no evidence of aphasia, agnosia, apraxia or anomia. Speech is clear with normal prosody and enunciation. Thought process is linear. Mood is normal and affect is normal.  Cranial nerves II - XII are as described above under HEENT exam.  Motor exam: Normal bulk, strength and tone is noted. There is no tremor. Fine motor skills and coordination: grossly intact.  Cerebellar testing: No dysmetria or intention tremor. There is no truncal or gait ataxia.  Sensory exam: intact to light touch in the upper and lower extremities.  Gait, station and balance: He stands easily. No veering to one side is noted. No leaning to one side is noted. Posture is age-appropriate and stance is narrow based. Gait shows normal stride length and normal pace. No problems turning are noted.    Assessment and Plan:  In summary, SELIG WAMPOLE is a very pleasant 31 year old male with an underlying medical history of cholecystitis, chest pain, hypertension, liver cyst, and severe obesity with a BMI of over 49, who presents for follow-up consultation of his obstructive sleep apnea, which was deemed in the moderate range by home sleep testing on 02/04/2021.  He has established treatment with AutoPap therapy since the beginning of December 2022 and is compliant with treatment.  His apnea scores look good, he endorses benefit from treatment. He is commended for his treatment adherence and advised to continue with full compliance. His leak is on the higher side but fluctuates. He is advised to continue to work on weight loss.  He is advised to follow-up routinely to see one of our nurse practitioners in 1 year in sleep clinic.  I answered all his questions today and he was in agreement. I spent 30 minutes in total face-to-face time and in reviewing records during pre-charting, more than 50% of which was spent in counseling and coordination of care, reviewing test results, reviewing  medications and treatment regimen and/or in discussing or reviewing the diagnosis of OSA, the prognosis and treatment options. Pertinent laboratory and imaging test results that were  available during this visit with the patient were reviewed by me and considered in my medical decision making (see chart for details).

## 2021-07-08 NOTE — Progress Notes (Signed)
Faxed to [employer], new autopap pressure orders with fax confirmation received [phone].

## 2021-08-08 NOTE — Telephone Encounter (Addendum)
I have resubmitted DOS 05/15/20 to [facility] the outstanding balance for 01/07/21 is coming from a copay of $40.00. ? ?I have left vm asking pt to return my call so I can advise this has been submitted to insurance. ? ?CB# [phone]. ?

## 2021-09-14 ENCOUNTER — Other Ambulatory Visit: Payer: Self-pay | Admitting: Family Medicine

## 2021-09-14 DIAGNOSIS — M1A9XX0 Chronic gout, unspecified, without tophus (tophi): Secondary | ICD-10-CM

## 2021-09-14 DIAGNOSIS — K047 Periapical abscess without sinus: Secondary | ICD-10-CM

## 2021-12-11 ENCOUNTER — Ambulatory Visit: Payer: Self-pay | Admitting: Family Medicine

## 2022-06-17 ENCOUNTER — Encounter: Payer: Self-pay | Admitting: Urology

## 2022-06-17 ENCOUNTER — Ambulatory Visit: Payer: Commercial Managed Care - HMO | Admitting: Urology

## 2022-06-17 VITALS — BP 168/85 | HR 82

## 2022-06-17 DIAGNOSIS — N50812 Left testicular pain: Secondary | ICD-10-CM

## 2022-06-17 MED ORDER — ALFUZOSIN HCL ER 10 MG PO TB24: 10.0000 mg | ORAL_TABLET | Freq: Every day | ORAL | 11 refills | Status: DC

## 2022-06-17 MED ORDER — MELOXICAM 15 MG PO TABS: 15.0000 mg | ORAL_TABLET | Freq: Every day | ORAL | 1 refills | Status: DC

## 2022-06-17 NOTE — Progress Notes (Signed)
H&P  Chief Complaint: Left testicular pain  History of Present Illness: 32 year old male self-referred for longstanding left testicular pain.  Has been intermittently bothersome for the past 2 to 3 years.  In September 2022 he underwent ultrasound of his scrotum.  This revealed small bilateral hydroceles but no other testicular issues.  For the past few weeks he has had an exacerbation.  No worsening or relieving symptoms.  He states that it has been more sensitive.  This does not bother him so much when he is lying flat but when he is up and around.  No change in urinary pattern, no change before or after or during ejaculation.  Past Medical History:  Diagnosis Date   Gallstones    Hypertension    Liver cyst 2009    No past surgical history on file.  Home Medications:  Allergies as of 06/17/2022       Reactions   Bee Venom    yellowjackets        Medication List        Accurate as of June 17, 2022  8:37 AM. If you have any questions, ask your nurse or doctor.          allopurinol 100 MG tablet Commonly known as: ZYLOPRIM TAKE 1 TABLET BY MOUTH ONCE A DAY.   colchicine 0.6 MG tablet Take 1 tablet (0.6 mg total) by mouth daily.   oxyCODONE 5 MG immediate release tablet Commonly known as: Roxicodone Take 1 tablet (5 mg total) by mouth every 4 (four) hours as needed for severe pain.        Allergies:  Allergies  Allergen Reactions   Bee Venom     yellowjackets    Family History  Problem Relation Age of Onset   Lung cancer Mother    Atrial fibrillation Mother    Hypertension Mother    Atrial fibrillation Father    Sleep apnea Brother    Heart disease Paternal Grandmother    Other Paternal Grandmother        carney complex   Lung cancer Paternal Grandfather     Social History:  reports that he has never smoked. He has never used smokeless tobacco. He reports current alcohol use. He reports that he does not use drugs.  ROS: A complete review of  systems was performed.  All systems are negative except for pertinent findings as noted.  Physical Exam:  Vital signs in last 24 hours: There were no vitals taken for this visit. Constitutional:  Alert and oriented, No acute distress Cardiovascular: Regular rate  Respiratory: Normal respiratory effort GI: Abdomen is soft, nontender, nondistended, no abdominal masses.  Obese, no inguinal hernias. Genitourinary: Normal male phallus, testes are descended bilaterally and non-tender and without masses, scrotum is normal in appearance without lesions or masses, perineum is normal on inspection.  Neurologic: Grossly intact, no focal deficits Psychiatric: Normal mood and affect  I have reviewed prior pt notes  I have reviewed urinalysis results     Impression/Assessment:  Orchialgia, left.  I do not feel this is related to a hernia or intratesticular abnormality.  He does have concurrent back pain so it may be radicular in nature  Plan:  1.  Reassurance regarding his exam and radiographic appearance on ultrasound  2.  I will prescribe meloxicam and alfuzosin as well as hot tub baths  3.  Return in 2 months for recheck

## 2022-06-18 LAB — URINALYSIS, ROUTINE W REFLEX MICROSCOPIC
Bilirubin, UA: NEGATIVE
Glucose, UA: NEGATIVE
Ketones, UA: NEGATIVE
Leukocytes,UA: NEGATIVE
Nitrite, UA: NEGATIVE
Protein,UA: NEGATIVE
RBC, UA: NEGATIVE
Specific Gravity, UA: <1.005 — ABNORMAL LOW (ref 1.005–1.030)
Urobilinogen, Ur: 0.2 mg/dL (ref 0.2–1.0)
pH, UA: 5 (ref 5.0–7.5)

## 2022-07-03 NOTE — Progress Notes (Deleted)
PATIENT: Marvin Rivera DOB: [birth year]  REASON FOR VISIT: follow up HISTORY FROM: patient  No chief complaint on file.    HISTORY OF PRESENT ILLNESS:  07/03/22 ALL:  Marvin Rivera is a 32 y.o. male here today for follow up for OSA on CPAP.      HISTORY: (copied from Dr Guadelupe Sabin previous note)  [name] is a 32 year old right-handed gentleman with an underlying medical history of cholecystitis, chest pain, hypertension, liver cyst, and severe obesity with a BMI of over 52, who Presents for follow-up consultation of his obstructive sleep apnea after interim home sleep testing and starting AutoPap therapy.  The patient is unaccompanied today.  I first met him at the request of his primary care physician on 12/12/2020, at which time he reported snoring, witnessed apneas as well as excessive daytime somnolence.  He was advised to proceed with a sleep study.  He had a home sleep test on 02/04/2021 which indicated moderate obstructive sleep apnea with a total AHI of 15.2/hour and O2 nadir of 85%.  Snoring was detected in the mild-to-moderate range, at times louder.  He was advised to start AutoPap therapy.  His set up date was 04/05/2021.  He has a ResMed air sense 10 machine.   Today, 07/03/2021: I reviewed his AutoPap compliance data from 06/02/2021 through 07/01/2021, which is a total of 30 days, during which time he used his machine every night with percent use days greater than 4 hours at 87%, indicating very good compliance with an average usage of 6 hours and 19 minutes, residual AHI at goal at 0.6/h, average pressure for the 95th percentile at 11.1 cm with a range of 6 to 18 cm, leak on the higher side with the 95th percentile at 29.1 L/min.  He reports feeling better.  He has better daytime energy and less tiredness.  He has had some weight gain, probably in part due to stress, he believes. His wife is noted that he no longer snores but he has had some leak from the mask.  He uses a fullface  mask, of note, he also has a full beard.   Of note, his DME company is [employer].  I had requested his AutoPap range to be 6 to 12 cm, not sure why they placed him on maximum pressure of 18 cm, maximum pressure for this past month was 12.3 cm.  He reports that sleep is sometimes interrupted because his 72-year-old does not sleep well.  He has a 34 year old and a 71-year-old as well.  His first machine was defective and he got a new machine in January 2023.  He has plenty of supplies and knows to change the filter monthly.   He had interim emergency room visits in November 2022 for acute gout and dental abscess, and an emergency room visit in January 2023 for abdominal pain.  He was found to have epiploic appendagitis.    REVIEW OF SYSTEMS: Out of a complete 14 system review of symptoms, the patient complains only of the following symptoms, and all other reviewed systems are negative.  ESS:  ALLERGIES: Allergies  Allergen Reactions   Bee Venom     yellowjackets    HOME MEDICATIONS: Outpatient Medications Prior to Visit  Medication Sig Dispense Refill   alfuzosin (UROXATRAL) 10 MG 24 hr tablet Take 1 tablet (10 mg total) by mouth daily with breakfast. 30 tablet 11   allopurinol (ZYLOPRIM) 100 MG tablet TAKE 1 TABLET BY MOUTH ONCE A DAY. (  Patient not taking: Reported on 06/17/2022) 30 tablet 0   colchicine 0.6 MG tablet Take 1 tablet (0.6 mg total) by mouth daily. (Patient not taking: Reported on 06/17/2022) 30 tablet 0   meloxicam (MOBIC) 15 MG tablet Take 1 tablet (15 mg total) by mouth daily. 20 tablet 1   oxyCODONE (ROXICODONE) 5 MG immediate release tablet Take 1 tablet (5 mg total) by mouth every 4 (four) hours as needed for severe pain. (Patient not taking: Reported on 07/03/2021) 5 tablet 0   No facility-administered medications prior to visit.    PAST MEDICAL HISTORY: Past Medical History:  Diagnosis Date   Gallstones    Hypertension    Liver cyst 2009    PAST SURGICAL  HISTORY: No past surgical history on file.  FAMILY HISTORY: Family History  Problem Relation Age of Onset   Lung cancer Mother    Atrial fibrillation Mother    Hypertension Mother    Atrial fibrillation Father    Sleep apnea Brother    Heart disease Paternal Grandmother    Other Paternal Grandmother        carney complex   Lung cancer Paternal Grandfather     SOCIAL HISTORY: Social History   Socioeconomic History   Marital status: Married    Spouse name: Not on file   Number of children: 3   Years of education: Not on file   Highest education level: Not on file  Occupational History   Not on file  Tobacco Use   Smoking status: Never   Smokeless tobacco: Never  Vaping Use   Vaping Use: Never used  Substance and Sexual Activity   Alcohol use: Yes    Comment: rarely   Drug use: No   Sexual activity: Not on file  Other Topics Concern   Not on file  Social History Narrative   Not on file   Social Determinants of Health   Financial Resource Strain: Not on file  Food Insecurity: Not on file  Transportation Needs: Not on file  Physical Activity: Not on file  Stress: Not on file  Social Connections: Not on file  Intimate Partner Violence: Not on file     PHYSICAL EXAM  There were no vitals filed for this visit. There is no height or weight on file to calculate BMI.  Generalized: Well developed, in no acute distress  Cardiology: normal rate and rhythm, no murmur noted Respiratory: clear to auscultation bilaterally  Neurological examination  Mentation: Alert oriented to time, place, history taking. Follows all commands speech and language fluent Cranial nerve II-XII: Pupils were equal round reactive to light. Extraocular movements were full, visual field were full  Motor: The motor testing reveals 5 over 5 strength of all 4 extremities. Good symmetric motor tone is noted throughout.  Gait and station: Gait is normal.    DIAGNOSTIC DATA (LABS, IMAGING,  TESTING) - I reviewed patient records, labs, notes, testing and imaging myself where available.      No data to display           Lab Results  Component Value Date   WBC 9.7 05/12/2021   HGB 13.7 05/12/2021   HCT 42.1 05/12/2021   MCV 90.9 05/12/2021   PLT 307 05/12/2021      Component Value Date/Time   NA 137 05/12/2021 1817   NA 143 10/01/2017 1537   K 4.0 05/12/2021 1817   CL 104 05/12/2021 1817   CO2 26 05/12/2021 1817   GLUCOSE 99 05/12/2021  1817   BUN 13 05/12/2021 1817   BUN 13 10/01/2017 1537   CREATININE 1.01 05/12/2021 1817   CALCIUM 9.1 05/12/2021 1817   PROT 7.5 05/12/2021 1817   PROT 7.3 10/01/2017 1537   ALBUMIN 4.3 05/12/2021 1817   ALBUMIN 5.0 10/01/2017 1537   AST 29 05/12/2021 1817   ALT 39 05/12/2021 1817   ALKPHOS 55 05/12/2021 1817   BILITOT 1.0 05/12/2021 1817   BILITOT 1.0 10/01/2017 1537   GFRNONAA >60 05/12/2021 1817   GFRAA 113 10/01/2017 1537   Lab Results  Component Value Date   CHOL 226 (H) 04/29/2017   HDL 42 04/29/2017   LDLCALC 129 (H) 04/29/2017   TRIG 274 (H) 04/29/2017   CHOLHDL 5.4 (H) 04/29/2017   No results found for: "HGBA1C" No results found for: "VITAMINB12" No results found for: "TSH"   ASSESSMENT AND PLAN 32 y.o. year old male  has a past medical history of Gallstones, Hypertension, and Liver cyst (2009). here with   No diagnosis found.    KAZEEM ZAMBITO is doing well on CPAP therapy. Compliance report reveals ***. He was encouraged to continue using CPAP nightly and for greater than 4 hours each night. We will update supply orders as indicated. Risks of untreated sleep apnea review and education materials provided. Healthy lifestyle habits encouraged. He will follow up in He, sooner if needed. He verbalizes understanding and agreement with this plan.    No orders of the defined types were placed in this encounter.    No orders of the defined types were placed in this encounter.     Debbora Presto, FNP-C  07/03/2022, 3:59 PM [facility] [address] [place], [place] [address] [phone]

## 2022-07-07 ENCOUNTER — Ambulatory Visit: Payer: Commercial Managed Care - HMO | Admitting: Family Medicine

## 2022-07-07 DIAGNOSIS — G4733 Obstructive sleep apnea (adult) (pediatric): Secondary | ICD-10-CM

## 2022-08-12 ENCOUNTER — Ambulatory Visit: Payer: Commercial Managed Care - HMO | Admitting: Urology

## 2022-09-09 ENCOUNTER — Ambulatory Visit: Payer: Commercial Managed Care - HMO | Admitting: Urology

## 2022-09-09 DIAGNOSIS — N50812 Left testicular pain: Secondary | ICD-10-CM

## 2022-09-18 ENCOUNTER — Ambulatory Visit (INDEPENDENT_AMBULATORY_CARE_PROVIDER_SITE_OTHER): Payer: BC Managed Care – PPO | Admitting: Nurse Practitioner

## 2022-09-18 VITALS — BP 132/86 | Ht 70.0 in

## 2022-09-18 DIAGNOSIS — R739 Hyperglycemia, unspecified: Secondary | ICD-10-CM | POA: Diagnosis not present

## 2022-09-18 DIAGNOSIS — Z79899 Other long term (current) drug therapy: Secondary | ICD-10-CM

## 2022-09-18 DIAGNOSIS — I10 Essential (primary) hypertension: Secondary | ICD-10-CM

## 2022-09-18 DIAGNOSIS — K219 Gastro-esophageal reflux disease without esophagitis: Secondary | ICD-10-CM | POA: Diagnosis not present

## 2022-09-18 DIAGNOSIS — Z13220 Encounter for screening for lipoid disorders: Secondary | ICD-10-CM

## 2022-09-18 DIAGNOSIS — R5383 Other fatigue: Secondary | ICD-10-CM

## 2022-09-18 DIAGNOSIS — Z7721 Contact with and (suspected) exposure to potentially hazardous body fluids: Secondary | ICD-10-CM

## 2022-09-18 NOTE — Progress Notes (Signed)
   Subjective:    Patient ID: Marvin Rivera, male    DOB: [birth year], 32 y.o.   MRN: [id number]  HPI  Patient arrives to discuss cut with razor in renters house.  Was cleaning out a rental house for his father about a week and a half ago.  Relative was living there who has a history of substance use.  A razor nicked him on his thumb.  Very tiny puncture which he states is fine now.  He is concerned because of the history of addiction and the possibility of being exposed to HIV.  Patient is also due for his regular lab work for his upcoming physical.  Same sexual partner.  No high risk behaviors.  Review of Systems  Constitutional:  Positive for fatigue. Negative for fever.  Respiratory:  Negative for chest tightness and shortness of breath.   Cardiovascular:  Negative for chest pain.       Objective:   Physical Exam NAD.  Alert, oriented.  Lungs clear.  Heart regular rate rhythm.  No open wounds or infection noted on the fingers of the right hand. Today's Vitals   09/18/22 1541  BP: 132/86  Height: 5\' 10"  (1.778 m)   Body mass index is 53.95 kg/m.        Assessment & Plan:   Problem List Items Addressed This Visit       Cardiovascular and Mediastinum   Hypertension - Primary   Relevant Orders   Comprehensive metabolic panel   Hemoglobin A1c   Lipid panel     Digestive   Gastroesophageal reflux disease without esophagitis   Relevant Orders   CBC with Differential/Platelet     Other   Hyperglycemia   Relevant Orders   Comprehensive metabolic panel   Hemoglobin A1c   Other Visit Diagnoses     High risk medication use       Relevant Orders   Magnesium   Exposure to blood or body fluid       Relevant Orders   CBC with Differential/Platelet   HIV Antibody (routine testing w rflx)   Hepatitis C antibody   Hepatitis B surface antigen   Fatigue, unspecified type       Relevant Orders   CBC with Differential/Platelet   Comprehensive metabolic panel   TSH    T4, free   HIV Antibody (routine testing w rflx)   Hepatitis C antibody   Hepatitis B surface antigen   Screening for lipid disorders       Relevant Orders   Lipid panel      Routine labs ordered including once ordered in February that were still active.  HIV hep C and hep B testing added to the labs per patient request due to recent possible exposure.  Patient understands it may take several weeks for an HIV test to return positive after exposure. Follow-up with Dr. Lorin Picket as planned on 10/16/2022.  Call back sooner if any problems.

## 2022-09-19 ENCOUNTER — Encounter: Payer: Self-pay | Admitting: Nurse Practitioner

## 2022-10-09 ENCOUNTER — Ambulatory Visit (INDEPENDENT_AMBULATORY_CARE_PROVIDER_SITE_OTHER): Payer: BC Managed Care – PPO | Admitting: General Surgery

## 2022-10-09 ENCOUNTER — Encounter: Payer: Self-pay | Admitting: General Surgery

## 2022-10-09 VITALS — BP 151/82 | HR 67 | Temp 98.0°F | Resp 16 | Ht 70.0 in | Wt 372.0 lb

## 2022-10-09 DIAGNOSIS — K8020 Calculus of gallbladder without cholecystitis without obstruction: Secondary | ICD-10-CM

## 2022-10-09 NOTE — H&P (Signed)
Marvin Rivera; [id number]; 1990/06/27   HPI Patient is a 32 year old white male who returns to my care for evaluation and treatment of biliary colic secondary to cholelithiasis.  I last saw him in my office in 2022.  At that time, he wanted to delay surgical intervention.  He states he has intermittent episodes of right upper quadrant abdominal pain, bloating, nausea.  It occurs spontaneously and is not associated with fatty foods.  He denies any fever, chills, or jaundice.  As he has had recurrent symptoms, he would like to proceed with a cholecystectomy. Past Medical History:  Diagnosis Date   Gallstones    Hypertension    Liver cyst 2009    History reviewed. No pertinent surgical history.  Family History  Problem Relation Age of Onset   Lung cancer Mother    Atrial fibrillation Mother    Hypertension Mother    Atrial fibrillation Father    Sleep apnea Brother    Heart disease Paternal Grandmother    Other Paternal Grandmother        carney complex   Lung cancer Paternal Grandfather     No current outpatient medications on file prior to visit.   No current facility-administered medications on file prior to visit.    Allergies  Allergen Reactions   Bee Venom     yellowjackets    Social History   Substance and Sexual Activity  Alcohol Use Yes   Comment: rarely    Social History   Tobacco Use  Smoking Status Never  Smokeless Tobacco Never    Review of Systems  Constitutional: Negative.   HENT: Negative.    Eyes: Negative.   Respiratory: Negative.    Cardiovascular: Negative.   Gastrointestinal:  Positive for abdominal pain.  Genitourinary: Negative.   Musculoskeletal: Negative.   Skin: Negative.   Neurological: Negative.   Endo/Heme/Allergies: Negative.   Psychiatric/Behavioral: Negative.      Objective   Vitals:   10/09/22 0902  BP: (!) 151/82  Pulse: 67  Resp: 16  Temp: 98 F (36.7 C)  SpO2: 94%    Physical Exam Vitals reviewed.   Constitutional:      Appearance: Normal appearance. He is obese. He is not ill-appearing.  HENT:     Head: Normocephalic and atraumatic.  Eyes:     General: No scleral icterus. Cardiovascular:     Rate and Rhythm: Normal rate and regular rhythm.     Heart sounds: Normal heart sounds. No murmur heard.    No friction rub. No gallop.  Pulmonary:     Effort: Pulmonary effort is normal. No respiratory distress.     Breath sounds: Normal breath sounds. No stridor. No wheezing, rhonchi or rales.  Abdominal:     General: Bowel sounds are normal. There is no distension.     Palpations: Abdomen is soft. There is no mass.     Tenderness: There is no abdominal tenderness. There is no guarding or rebound.     Hernia: No hernia is present.  Skin:    General: Skin is warm and dry.  Neurological:     Mental Status: He is alert and oriented to person, place, and time.    Previous office notes reviewed.  Recent labs reviewed. Assessment  Biliary colic secondary to cholelithiasis Plan  Patient is scheduled for robotic assisted laparoscopic cholecystectomy on 10/24/2022.  The risks and benefits of the procedure including bleeding, infection, hepatobiliary injury, and the possibility of an open procedure were fully explained to  the patient, who gave informed consent.

## 2022-10-09 NOTE — Progress Notes (Signed)
Marvin Rivera; [id number]; 1990/06/27   HPI Patient is a 32 year old white male who returns to my care for evaluation and treatment of biliary colic secondary to cholelithiasis.  I last saw him in my office in 2022.  At that time, he wanted to delay surgical intervention.  He states he has intermittent episodes of right upper quadrant abdominal pain, bloating, nausea.  It occurs spontaneously and is not associated with fatty foods.  He denies any fever, chills, or jaundice.  As he has had recurrent symptoms, he would like to proceed with a cholecystectomy. Past Medical History:  Diagnosis Date   Gallstones    Hypertension    Liver cyst 2009    History reviewed. No pertinent surgical history.  Family History  Problem Relation Age of Onset   Lung cancer Mother    Atrial fibrillation Mother    Hypertension Mother    Atrial fibrillation Father    Sleep apnea Brother    Heart disease Paternal Grandmother    Other Paternal Grandmother        carney complex   Lung cancer Paternal Grandfather     No current outpatient medications on file prior to visit.   No current facility-administered medications on file prior to visit.    Allergies  Allergen Reactions   Bee Venom     yellowjackets    Social History   Substance and Sexual Activity  Alcohol Use Yes   Comment: rarely    Social History   Tobacco Use  Smoking Status Never  Smokeless Tobacco Never    Review of Systems  Constitutional: Negative.   HENT: Negative.    Eyes: Negative.   Respiratory: Negative.    Cardiovascular: Negative.   Gastrointestinal:  Positive for abdominal pain.  Genitourinary: Negative.   Musculoskeletal: Negative.   Skin: Negative.   Neurological: Negative.   Endo/Heme/Allergies: Negative.   Psychiatric/Behavioral: Negative.      Objective   Vitals:   10/09/22 0902  BP: (!) 151/82  Pulse: 67  Resp: 16  Temp: 98 F (36.7 C)  SpO2: 94%    Physical Exam Vitals reviewed.   Constitutional:      Appearance: Normal appearance. He is obese. He is not ill-appearing.  HENT:     Head: Normocephalic and atraumatic.  Eyes:     General: No scleral icterus. Cardiovascular:     Rate and Rhythm: Normal rate and regular rhythm.     Heart sounds: Normal heart sounds. No murmur heard.    No friction rub. No gallop.  Pulmonary:     Effort: Pulmonary effort is normal. No respiratory distress.     Breath sounds: Normal breath sounds. No stridor. No wheezing, rhonchi or rales.  Abdominal:     General: Bowel sounds are normal. There is no distension.     Palpations: Abdomen is soft. There is no mass.     Tenderness: There is no abdominal tenderness. There is no guarding or rebound.     Hernia: No hernia is present.  Skin:    General: Skin is warm and dry.  Neurological:     Mental Status: He is alert and oriented to person, place, and time.    Previous office notes reviewed.  Recent labs reviewed. Assessment  Biliary colic secondary to cholelithiasis Plan  Patient is scheduled for robotic assisted laparoscopic cholecystectomy on 10/24/2022.  The risks and benefits of the procedure including bleeding, infection, hepatobiliary injury, and the possibility of an open procedure were fully explained to  the patient, who gave informed consent.

## 2022-10-14 LAB — T4, FREE: Free T4: 0.92 ng/dL (ref 0.82–1.77)

## 2022-10-14 LAB — COMPREHENSIVE METABOLIC PANEL
ALT: 41 IU/L (ref 0–44)
AST: 50 IU/L — ABNORMAL HIGH (ref 0–40)
Albumin: 4.4 g/dL (ref 4.1–5.1)
Alkaline Phosphatase: 76 IU/L (ref 44–121)
BUN/Creatinine Ratio: 13 (ref 9–20)
BUN: 13 mg/dL (ref 6–20)
Bilirubin Total: 1.2 mg/dL (ref 0.0–1.2)
CO2: 21 mmol/L (ref 20–29)
Calcium: 9.6 mg/dL (ref 8.7–10.2)
Chloride: 102 mmol/L (ref 96–106)
Creatinine, Ser: 1.01 mg/dL (ref 0.76–1.27)
Globulin, Total: 2.3 g/dL (ref 1.5–4.5)
Glucose: 102 mg/dL — ABNORMAL HIGH (ref 70–99)
Potassium: 4.1 mmol/L (ref 3.5–5.2)
Sodium: 138 mmol/L (ref 134–144)
Total Protein: 6.7 g/dL (ref 6.0–8.5)
eGFR: 100 mL/min/{1.73_m2} (ref >59)

## 2022-10-14 LAB — LIPID PANEL
Chol/HDL Ratio: 5.5 ratio — ABNORMAL HIGH (ref 0.0–5.0)
Cholesterol, Total: 199 mg/dL (ref 100–199)
HDL: 36 mg/dL — ABNORMAL LOW (ref >39)
LDL Chol Calc (NIH): 135 mg/dL — ABNORMAL HIGH (ref 0–99)
Triglycerides: 157 mg/dL — ABNORMAL HIGH (ref 0–149)
VLDL Cholesterol Cal: 28 mg/dL (ref 5–40)

## 2022-10-14 LAB — CBC WITH DIFFERENTIAL/PLATELET
Basophils Absolute: 0.1 10*3/uL (ref 0.0–0.2)
Basos: 1 %
EOS (ABSOLUTE): 0.2 10*3/uL (ref 0.0–0.4)
Eos: 3 %
Hematocrit: 40.3 % (ref 37.5–51.0)
Hemoglobin: 13.6 g/dL (ref 13.0–17.7)
Immature Grans (Abs): 0 10*3/uL (ref 0.0–0.1)
Immature Granulocytes: 0 %
Lymphocytes Absolute: 2.3 10*3/uL (ref 0.7–3.1)
Lymphs: 37 %
MCH: 29.3 pg (ref 26.6–33.0)
MCHC: 33.7 g/dL (ref 31.5–35.7)
MCV: 87 fL (ref 79–97)
Monocytes Absolute: 0.6 10*3/uL (ref 0.1–0.9)
Monocytes: 10 %
Neutrophils Absolute: 3.1 10*3/uL (ref 1.4–7.0)
Neutrophils: 49 %
Platelets: 308 10*3/uL (ref 150–450)
RBC: 4.64 x10E6/uL (ref 4.14–5.80)
RDW: 13 % (ref 11.6–15.4)
WBC: 6.2 10*3/uL (ref 3.4–10.8)

## 2022-10-14 LAB — HEPATITIS C ANTIBODY: Hep C Virus Ab: NONREACTIVE

## 2022-10-14 LAB — HEMOGLOBIN A1C
Est. average glucose Bld gHb Est-mCnc: 123 mg/dL
Hgb A1c MFr Bld: 5.9 % — ABNORMAL HIGH (ref 4.8–5.6)

## 2022-10-14 LAB — HIV ANTIBODY (ROUTINE TESTING W REFLEX): HIV Screen 4th Generation wRfx: NONREACTIVE

## 2022-10-14 LAB — TSH: TSH: 2.15 u[IU]/mL (ref 0.450–4.500)

## 2022-10-14 LAB — HEPATITIS B SURFACE ANTIGEN: Hepatitis B Surface Ag: NEGATIVE

## 2022-10-14 LAB — MAGNESIUM: Magnesium: 2.4 mg/dL — ABNORMAL HIGH (ref 1.6–2.3)

## 2022-10-16 ENCOUNTER — Ambulatory Visit: Payer: BC Managed Care – PPO | Admitting: Family Medicine

## 2022-10-16 VITALS — BP 133/78 | HR 61 | Temp 97.9°F | Ht 70.0 in | Wt 269.0 lb

## 2022-10-16 DIAGNOSIS — Z0001 Encounter for general adult medical examination with abnormal findings: Secondary | ICD-10-CM

## 2022-10-16 DIAGNOSIS — R1084 Generalized abdominal pain: Secondary | ICD-10-CM

## 2022-10-16 DIAGNOSIS — K760 Fatty (change of) liver, not elsewhere classified: Secondary | ICD-10-CM

## 2022-10-16 DIAGNOSIS — R7401 Elevation of levels of liver transaminase levels: Secondary | ICD-10-CM | POA: Diagnosis not present

## 2022-10-16 DIAGNOSIS — Z0000 Encounter for general adult medical examination without abnormal findings: Secondary | ICD-10-CM

## 2022-10-16 DIAGNOSIS — Z206 Contact with and (suspected) exposure to human immunodeficiency virus [HIV]: Secondary | ICD-10-CM

## 2022-10-16 DIAGNOSIS — Z8489 Family history of other specified conditions: Secondary | ICD-10-CM

## 2022-10-16 NOTE — Progress Notes (Signed)
   Subjective:    Patient ID: Marvin Rivera, male    DOB: 1990/05/20, 32 y.o.   MRN: [id number]  HPI The patient comes in today for a wellness visit.  Hello there is not having previously when she did not due to my work working in the visit is a good or bad good day  A review of their health history was completed.  A review of medications was also completed.  Any needed refills; no  Eating habits: poor  Falls/  MVA accidents in past few months: no  Regular exercise: work and walk when able  Specialist pt sees on regular basis: no  Preventative health issues were discussed.   Additional concerns: colon felt like it popped llq, random abd pains no other abnormal symptoms  reported   Review of Systems     Objective:   Physical Exam  General-in no acute distress Eyes-no discharge Lungs-respiratory rate normal, CTA CV-no murmurs,RRR Extremities skin warm dry no edema Neuro grossly normal Behavior normal, alert  GU normal no hernia detected     Assessment & Plan:  1. Well adult exam Adult wellness-complete.wellness physical was conducted today. Importance of diet and exercise were discussed in detail.  Importance of stress reduction and healthy living were discussed.  In addition to this a discussion regarding safety was also covered.  We also reviewed over immunizations and gave recommendations regarding current immunization needed for age.   In addition to this additional areas were also touched on including: Preventative health exams needed:  Colonoscopy not indicated currently  Patient was advised yearly wellness exam   2. Elevated transaminase level More than likely fatty liver we will have the patient follow-up for repeat liver profile in several months with a follow-up office visit he is going aggressively eat healthier stay physically active and try to lose weight he has been successful to some degree Although he is a good candidate for GLP-1's the cost  of these is prohibitive  3. HIV exposure Patient unfortunately states he was cleaning out an apartment and got cut with a razor blade that was an apartment of a person who is HIV positive although he states the razor blade had not been used on the person instead with frequent use drugs.  Obviously the patient states he has not been doing drugs he was just helping clean out evicted apartment - HIV antibody (with reflex)  4. Generalized abdominal pain Patient has intermittent lower abdominal pains discomfort sometimes fluid sensation sometimes pain sensation states that this comes for no reason but denies bloody stools would like to see gastroenterology - Ambulatory referral to Gastroenterology  5. Fatty liver Aggressive healthy eating as well as physical activity to lose weight will follow-up again in 4 to 5 months - Hepatic Function Panel  6. Morbid obesity (HCC) Portion control minimize sugars cut back on so does try to shift over to unsweetened tea and water regular physical activity  Referral will be made to the genetics team for this potential Carney complex-he has a family history on his dad side

## 2022-10-17 ENCOUNTER — Encounter: Payer: Self-pay | Admitting: Family Medicine

## 2022-10-20 ENCOUNTER — Encounter (HOSPITAL_COMMUNITY): Payer: Self-pay

## 2022-10-22 ENCOUNTER — Encounter (HOSPITAL_COMMUNITY)
Admission: RE | Admit: 2022-10-22 | Discharge: 2022-10-22 | Disposition: A | Discharge status: Home / Self Care | Payer: BC Managed Care – PPO | Source: Ambulatory Visit | Attending: General Surgery | Admitting: General Surgery

## 2022-10-22 HISTORY — DX: Sleep apnea, unspecified: G47.30

## 2022-10-22 NOTE — Patient Instructions (Signed)
EDDER BELLANCA  10/22/2022     @PREFPERIOPPHARMACY @   Your procedure is scheduled on 10/24/22.  Report to [facility] at 1035 A.M.  Call this number if you have problems the morning of surgery:  [phone]  If you experience any cold or flu symptoms such as cough, fever, chills, shortness of breath, etc. between now and your scheduled surgery, please notify us at the above number.   Remember:  Do not eat or drink after midnight.      Take these medicines the morning of surgery with A SIP OF WATER none    Do not wear jewelry, make-up or nail polish, including gel polish,  artificial nails, or any other type of covering on natural nails (fingers and  toes).  Do not wear lotions, powders, or perfumes, or deodorant.  Do not shave 48 hours prior to surgery.  Men may shave face and neck.  Do not bring valuables to the hospital.  [facility] is not responsible for any belongings or valuables.  Contacts, dentures or bridgework may not be worn into surgery.  Leave your suitcase in the car.  After surgery it may be brought to your room.  For patients admitted to the hospital, discharge time will be determined by your treatment team.  Patients discharged the day of surgery will not be allowed to drive home.   Name and phone number of your driver:   wife, Anais Koenen   Please read over the following fact sheets that you were given. Coughing and Deep Breathing, Surgical Site Infection Prevention, Anesthesia Post-op Instructions, and Care and Recovery After Surgery  Minimally Invasive Cholecystectomy, Care After What can I expect after the procedure? After the procedure, it is common to: Have pain at the areas of surgery. You will be given medicines for pain. Vomit or feel like you may vomit. Feel fullness in the belly (bloating) or have pain in the shoulder. This comes from the gas that was used during the surgery. Follow these instructions at home: Medicines Take  over-the-counter and prescription medicines only as told by your doctor. If you were prescribed an antibiotic medicine, take it as told by your doctor. Do not stop taking it even if you start to feel better. If told, take steps to prevent problems with pooping (constipation). You may need to: Drink enough fluid to keep your pee (urine) pale yellow. Take medicines. You will be told what medicines to take. Eat foods that are high in fiber. These include beans, whole grains, and fresh fruits and vegetables. Limit foods that are high in fat and sugar. These include fried or sweet foods. Ask your doctor if you should avoid driving or using machines while you are taking your medicine. Incision care  Follow instructions from your doctor about how to take care of your cuts from surgery (incisions). Make sure you: Wash your hands with soap and water for at least 20 seconds before and after you change your bandage (dressing). If you cannot use soap and water, use hand sanitizer. Change your bandage. Leave stitches (sutures) or skin glue in place for at least 2 weeks. Leave tape strips alone unless you are told to take them off. You may trim the edges of the tape strips if they curl up. Do not take baths, swim, or use a hot tub. Ask your doctor about taking showers or sponge baths. Check your incision area every day for signs of infection. Check for: More redness, swelling, or pain. Fluid  or blood. Warmth. Pus or a bad smell. Activity Rest as told by your doctor. Do not do activities that require a lot of effort. Get up to take short walks every 1 to 2 hours. Ask for help if you feel weak or unsteady. Do not lift anything that is heavier than 10 lb (4.5 kg), or the limit that you are told. Do not play contact sports until your doctor says it is okay. Do not return to work or school until your doctor says it is okay. Return to your normal activities when your doctor says that it is safe. General  instructions If you were given a sedative during your procedure, do not drive or use machines until your doctor says that it is safe. A sedative is a medicine that helps you relax. Keep all follow-up visits. Contact a doctor if: You get a rash. You have more redness, swelling, or pain around your incisions. You have fluid or blood coming from your incisions. Your incisions feel warm to the touch. You have pus or a bad smell coming from your incisions. You have a fever. One or more of your incisions breaks open. Get help right away if: You have trouble breathing. You have chest pain. You have pain that is getting worse in your shoulders. You faint or feel dizzy when you stand. You have very bad pain in your belly (abdomen). You feel like you may vomit or you vomit, and this lasts for more than one day. You have leg pain. These symptoms may be an emergency. Get help right away. Call 911. Do not wait to see if the symptoms will go away. Do not drive yourself to the hospital. Summary After your surgery, it is common to have pain at the areas of surgery. You may also vomit or feel fullness in the belly. Follow your doctor's instructions about medicine, activity restrictions, and caring for your surgery areas. Do not do activities that require a lot of effort. Contact a doctor if you have a fever or other signs of infection, such as more redness, swelling, or pain around your incisions. Get help right away if you have chest pain, increasing pain in the shoulders, or trouble breathing. This information is not intended to replace advice given to you by your health care provider. Make sure you discuss any questions you have with your health care provider. Document Revised: 10/16/2020 Document Reviewed: 10/16/2020 Elsevier Patient Education  2024 [employer].

## 2022-10-22 NOTE — Addendum Note (Signed)
Addended by: Margaretha Sheffield on: 10/22/2022 11:54 AM   Modules accepted: Orders

## 2022-10-23 ENCOUNTER — Telehealth: Payer: Self-pay | Admitting: Family Medicine

## 2022-10-23 NOTE — Telephone Encounter (Signed)
Left message regarding appointment times/dates

## 2022-10-24 ENCOUNTER — Ambulatory Visit (HOSPITAL_COMMUNITY): Payer: BC Managed Care – PPO | Admitting: Certified Registered Nurse Anesthetist

## 2022-10-24 ENCOUNTER — Other Ambulatory Visit: Payer: Self-pay

## 2022-10-24 ENCOUNTER — Encounter (HOSPITAL_COMMUNITY): Payer: Self-pay | Admitting: General Surgery

## 2022-10-24 ENCOUNTER — Ambulatory Visit (HOSPITAL_COMMUNITY)
Admission: RE | Admit: 2022-10-24 | Discharge: 2022-10-24 | Disposition: A | Discharge status: Home / Self Care | Payer: BC Managed Care – PPO | Attending: General Surgery | Admitting: General Surgery

## 2022-10-24 ENCOUNTER — Encounter (HOSPITAL_COMMUNITY)
Admission: RE | Disposition: A | Discharge status: Home / Self Care | Payer: Self-pay | Source: Home / Self Care | Attending: General Surgery

## 2022-10-24 DIAGNOSIS — I10 Essential (primary) hypertension: Secondary | ICD-10-CM | POA: Diagnosis not present

## 2022-10-24 DIAGNOSIS — K811 Chronic cholecystitis: Secondary | ICD-10-CM | POA: Insufficient documentation

## 2022-10-24 DIAGNOSIS — G4730 Sleep apnea, unspecified: Secondary | ICD-10-CM | POA: Insufficient documentation

## 2022-10-24 DIAGNOSIS — K8020 Calculus of gallbladder without cholecystitis without obstruction: Secondary | ICD-10-CM

## 2022-10-24 SURGERY — CHOLECYSTECTOMY, ROBOT-ASSISTED, LAPAROSCOPIC
Anesthesia: General | Site: Abdomen

## 2022-10-24 MED ORDER — ONDANSETRON HCL 4 MG/2ML IJ SOLN: 4.0000 mg | Freq: Once | INTRAMUSCULAR | Status: DC | PRN

## 2022-10-24 MED ORDER — CHLORHEXIDINE GLUCONATE CLOTH 2 % EX PADS: 6.0000 | MEDICATED_PAD | Freq: Once | CUTANEOUS | Status: DC

## 2022-10-24 MED ORDER — KETOROLAC TROMETHAMINE 30 MG/ML IJ SOLN
30.0000 mg | Freq: Once | INTRAMUSCULAR | Status: AC
  Administered 2022-10-24: 30 mg via INTRAVENOUS
  Filled 2022-10-24: qty 1

## 2022-10-24 MED ORDER — ROCURONIUM BROMIDE 10 MG/ML (PF) SYRINGE
PREFILLED_SYRINGE | INTRAVENOUS | Status: AC
  Filled 2022-10-24: qty 30

## 2022-10-24 MED ORDER — FENTANYL CITRATE (PF) 250 MCG/5ML IJ SOLN
INTRAMUSCULAR | Status: AC
  Filled 2022-10-24: qty 5

## 2022-10-24 MED ORDER — OXYCODONE HCL 5 MG PO TABS: 5.0000 mg | ORAL_TABLET | ORAL | 0 refills | Status: DC | PRN

## 2022-10-24 MED ORDER — ORAL CARE MOUTH RINSE: 15.0000 mL | Freq: Once | OROMUCOSAL | Status: AC

## 2022-10-24 MED ORDER — DEXAMETHASONE SODIUM PHOSPHATE 10 MG/ML IJ SOLN
INTRAMUSCULAR | Status: AC
  Filled 2022-10-24: qty 2

## 2022-10-24 MED ORDER — DEXAMETHASONE SODIUM PHOSPHATE 10 MG/ML IJ SOLN
INTRAMUSCULAR | Status: DC | PRN
  Administered 2022-10-24: 10 mg via INTRAVENOUS

## 2022-10-24 MED ORDER — PROPOFOL 500 MG/50ML IV EMUL
INTRAVENOUS | Status: AC
  Filled 2022-10-24: qty 200

## 2022-10-24 MED ORDER — EPHEDRINE 5 MG/ML INJ
INTRAVENOUS | Status: AC
  Filled 2022-10-24: qty 10

## 2022-10-24 MED ORDER — LIDOCAINE HCL (CARDIAC) PF 100 MG/5ML IV SOSY
PREFILLED_SYRINGE | INTRAVENOUS | Status: DC | PRN
  Administered 2022-10-24: 60 mg via INTRATRACHEAL

## 2022-10-24 MED ORDER — PROPOFOL 500 MG/50ML IV EMUL
INTRAVENOUS | Status: DC | PRN
  Administered 2022-10-24: 50 ug/kg/min via INTRAVENOUS

## 2022-10-24 MED ORDER — ORAL CARE MOUTH RINSE: 15.0000 mL | Freq: Once | OROMUCOSAL | Status: DC

## 2022-10-24 MED ORDER — OXYCODONE HCL 5 MG PO TABS: 5.0000 mg | ORAL_TABLET | Freq: Once | ORAL | Status: DC | PRN

## 2022-10-24 MED ORDER — INDOCYANINE GREEN 25 MG IV SOLR: 5.0000 mg | Freq: Once | INTRAVENOUS | Status: AC

## 2022-10-24 MED ORDER — INDOCYANINE GREEN 25 MG IV SOLR
INTRAVENOUS | Status: AC
  Administered 2022-10-24: 5 mg via INTRAVENOUS
  Filled 2022-10-24: qty 10

## 2022-10-24 MED ORDER — FENTANYL CITRATE (PF) 250 MCG/5ML IJ SOLN
INTRAMUSCULAR | Status: DC | PRN
  Administered 2022-10-24: 100 ug via INTRAVENOUS
  Administered 2022-10-24: 150 ug via INTRAVENOUS
  Administered 2022-10-24: 100 ug via INTRAVENOUS

## 2022-10-24 MED ORDER — CHLORHEXIDINE GLUCONATE 0.12 % MT SOLN: 15.0000 mL | Freq: Once | OROMUCOSAL | Status: DC

## 2022-10-24 MED ORDER — OXYCODONE HCL 5 MG/5ML PO SOLN: 5.0000 mg | Freq: Once | ORAL | Status: DC | PRN

## 2022-10-24 MED ORDER — CEFAZOLIN IN SODIUM CHLORIDE 3-0.9 GM/100ML-% IV SOLN
INTRAVENOUS | Status: AC
  Filled 2022-10-24: qty 100

## 2022-10-24 MED ORDER — FENTANYL CITRATE (PF) 100 MCG/2ML IJ SOLN
INTRAMUSCULAR | Status: AC
  Filled 2022-10-24: qty 2

## 2022-10-24 MED ORDER — PROPOFOL 10 MG/ML IV BOLUS
INTRAVENOUS | Status: DC | PRN
  Administered 2022-10-24: 200 mg via INTRAVENOUS

## 2022-10-24 MED ORDER — LACTATED RINGERS IV SOLN: INTRAVENOUS | Status: DC

## 2022-10-24 MED ORDER — ONDANSETRON HCL 4 MG/2ML IJ SOLN
INTRAMUSCULAR | Status: DC | PRN
  Administered 2022-10-24: 4 mg via INTRAVENOUS

## 2022-10-24 MED ORDER — STERILE WATER FOR IRRIGATION IR SOLN
Status: DC | PRN
  Administered 2022-10-24: 500 mL

## 2022-10-24 MED ORDER — BUPIVACAINE HCL (PF) 0.5 % IJ SOLN
INTRAMUSCULAR | Status: AC
  Filled 2022-10-24: qty 30

## 2022-10-24 MED ORDER — MIDAZOLAM HCL 2 MG/2ML IJ SOLN
INTRAMUSCULAR | Status: AC
  Filled 2022-10-24: qty 2

## 2022-10-24 MED ORDER — CHLORHEXIDINE GLUCONATE 0.12 % MT SOLN
15.0000 mL | Freq: Once | OROMUCOSAL | Status: AC
  Administered 2022-10-24: 15 mL via OROMUCOSAL

## 2022-10-24 MED ORDER — ROCURONIUM BROMIDE 100 MG/10ML IV SOLN
INTRAVENOUS | Status: DC | PRN
  Administered 2022-10-24: 20 mg via INTRAVENOUS
  Administered 2022-10-24: 80 mg via INTRAVENOUS

## 2022-10-24 MED ORDER — SUGAMMADEX SODIUM 200 MG/2ML IV SOLN
INTRAVENOUS | Status: DC | PRN
  Administered 2022-10-24: 244 mg via INTRAVENOUS

## 2022-10-24 MED ORDER — CEFAZOLIN IN SODIUM CHLORIDE 3-0.9 GM/100ML-% IV SOLN
3.0000 g | INTRAVENOUS | Status: AC
  Administered 2022-10-24: 3 g via INTRAVENOUS

## 2022-10-24 MED ORDER — BUPIVACAINE HCL (PF) 0.5 % IJ SOLN
INTRAMUSCULAR | Status: DC | PRN
  Administered 2022-10-24: 30 mL

## 2022-10-24 MED ORDER — FENTANYL CITRATE PF 50 MCG/ML IJ SOSY
25.0000 ug | PREFILLED_SYRINGE | INTRAMUSCULAR | Status: DC | PRN
  Administered 2022-10-24 (×2): 50 ug via INTRAVENOUS
  Filled 2022-10-24 (×2): qty 1

## 2022-10-24 MED ORDER — ONDANSETRON HCL 4 MG/2ML IJ SOLN
INTRAMUSCULAR | Status: AC
  Filled 2022-10-24: qty 6

## 2022-10-24 MED ORDER — MIDAZOLAM HCL 5 MG/5ML IJ SOLN
INTRAMUSCULAR | Status: DC | PRN
  Administered 2022-10-24: 2 mg via INTRAVENOUS

## 2022-10-24 SURGICAL SUPPLY — 38 items
ADH SKN CLS APL DERMABOND .7 (GAUZE/BANDAGES/DRESSINGS) ×1
APL PRP STRL LF DISP 70% ISPRP (MISCELLANEOUS) ×1
CAUTERY HOOK MNPLR 1.6 DVNC XI (INSTRUMENTS) ×1 IMPLANT
CHLORAPREP W/TINT 26 (MISCELLANEOUS) ×1 IMPLANT
CLIP LIGATING HEM O LOK PURPLE (MISCELLANEOUS) ×1 IMPLANT
COVER TIP SHEARS 8 DVNC (MISCELLANEOUS) IMPLANT
DERMABOND ADVANCED .7 DNX12 (GAUZE/BANDAGES/DRESSINGS) ×1 IMPLANT
DRAPE ARM DVNC X/XI (DISPOSABLE) ×4 IMPLANT
DRAPE COLUMN DVNC XI (DISPOSABLE) ×1 IMPLANT
ELECT REM PT RETURN 9FT ADLT (ELECTROSURGICAL) ×1
ELECTRODE REM PT RTRN 9FT ADLT (ELECTROSURGICAL) ×1 IMPLANT
FORCEPS BPLR R/ABLATION 8 DVNC (INSTRUMENTS) ×1 IMPLANT
FORCEPS PROGRASP DVNC XI (FORCEP) ×1 IMPLANT
GLOVE BIO SURGEON STRL SZ7 (GLOVE) IMPLANT
GLOVE BIOGEL PI IND STRL 7.0 (GLOVE) ×2 IMPLANT
GLOVE SURG SS PI 7.5 STRL IVOR (GLOVE) ×2 IMPLANT
GOWN STRL REUS W/TWL LRG LVL3 (GOWN DISPOSABLE) ×3 IMPLANT
IRRIGATOR SUCT 8 DISP DVNC XI (IRRIGATION / IRRIGATOR) IMPLANT
KIT TURNOVER KIT A (KITS) ×1 IMPLANT
MANIFOLD NEPTUNE II (INSTRUMENTS) ×1 IMPLANT
NDL HYPO 21X1.5 SAFETY (NEEDLE) ×1 IMPLANT
NDL INSUFFLATION 14GA 120MM (NEEDLE) ×1 IMPLANT
NEEDLE HYPO 21X1.5 SAFETY (NEEDLE) ×1 IMPLANT
NEEDLE INSUFFLATION 14GA 120MM (NEEDLE) ×1 IMPLANT
OBTURATOR OPTICAL STND 8 DVNC (TROCAR) ×1
OBTURATOR OPTICALSTD 8 DVNC (TROCAR) ×1 IMPLANT
PACK LAP CHOLE LZT030E (CUSTOM PROCEDURE TRAY) ×1 IMPLANT
PAD ARMBOARD 7.5X6 YLW CONV (MISCELLANEOUS) ×1 IMPLANT
PENCIL HANDSWITCHING (ELECTRODE) ×1 IMPLANT
POSITIONER HEAD 8X9X4 ADT (SOFTGOODS) ×1 IMPLANT
SCISSORS MNPLR CVD DVNC XI (INSTRUMENTS) IMPLANT
SEAL CANN UNIV 5-8 DVNC XI (MISCELLANEOUS) ×4 IMPLANT
SET TUBE SMOKE EVAC HIGH FLOW (TUBING) ×1 IMPLANT
SUT MNCRL AB 4-0 PS2 18 (SUTURE) ×2 IMPLANT
SYR 30ML LL (SYRINGE) ×1 IMPLANT
SYS RETRIEVAL 5MM INZII UNIV (BASKET) ×1
SYSTEM RETRIEVL 5MM INZII UNIV (BASKET) ×1 IMPLANT
WATER STERILE IRR 500ML POUR (IV SOLUTION) ×1 IMPLANT

## 2022-10-24 NOTE — Interval H&P Note (Signed)
History and Physical Interval Note:  10/24/2022 11:10 AM  Marvin Rivera  has presented today for surgery, with the diagnosis of CHOLELITHIASIS.  The various methods of treatment have been discussed with the patient and family. After consideration of risks, benefits and other options for treatment, the patient has consented to  Procedure(s): XI ROBOTIC ASSISTED LAPAROSCOPIC CHOLECYSTECTOMY (N/A) as a surgical intervention.  The patient's history has been reviewed, patient examined, no change in status, stable for surgery.  I have reviewed the patient's chart and labs.  Questions were answered to the patient's satisfaction.     Franky Macho

## 2022-10-24 NOTE — Op Note (Signed)
Patient:  Marvin Rivera  DOB:  12/03/1990  MRN:  [id number]   Preop Diagnosis: Biliary colic, cholelithiasis  Postop Diagnosis: Same  Procedure: Robotic assisted laparoscopic cholecystectomy  Surgeon: Franky Macho, MD  Anes: General endotracheal  Indications: Patient is a 32 year old white male who presents with biliary colic secondary to cholelithiasis.  The risks and benefits of the procedure including bleeding, infection, hepatobiliary injury, and the possibility of an open procedure were fully explained to the patient, who gave informed consent.  Procedure note: The patient was placed in the supine position.  After induction of general endotracheal anesthesia, the abdomen was prepped and draped using the usual sterile technique with ChloraPrep.  Surgical site confirmation was performed.  An infraumbilical incision was made down to the fascia.  A Veress needle was introduced into the abdominal cavity and confirmation of placement was done using the saline drop test.  The abdomen was then insufflated to 15 mmHg pressure.  An 8 mm trocar was introduced into the abdominal cavity under direct visualization without difficulty.  The patient was placed in reverse Trendelenburg position and an additional 8 mm trocars were placed in the left upper quadrant, right lower abdomen, and right flank regions.  The robot was then targeted and docked.  The liver appeared to be within normal limits.  The gallbladder was retracted in a dynamic fashion in order to provide a critical view of the triangle of Calot.  The cystic duct was first identified.  Its junction to the infundibulum was fully identified.  This was confirmed by firefly.  Hem-o-lok clips were placed proximally distally on the cystic duct and the cystic duct was divided.  This was likewise done and cystic artery.  The gallbladder was freed away from the gallbladder fossa using Bovie electrocautery.  The gallbladder was delivered through the left  upper quadrant trocar site using an Endo Catch bag.  The gallbladder fossa was inspected and no abnormal bleeding or bile leakage was noted.  The robot was then undocked and all air was evacuated from the abdominal cavity prior to removal of the trocars.  All wounds were irrigated normal saline.  All wounds were injected with point 5% Sensorcaine.  All incisions were closed using 4-0 Monocryl subcuticular suture.  Dermabond was applied.  All tape and needle counts were correct at the end of the procedure.  The patient was extubated in the operating room and transferred to PACU in stable condition.  Complications: None  EBL: Minimal  Specimen: Gallbladder

## 2022-10-24 NOTE — Anesthesia Preprocedure Evaluation (Signed)
Anesthesia Evaluation  Patient identified by MRN, date of birth, ID band Patient awake    Reviewed: Allergy & Precautions, H&P , NPO status , Patient's Chart, lab work & pertinent test results, reviewed documented beta blocker date and time   Airway Mallampati: II  TM Distance: >3 FB Neck ROM: full    Dental no notable dental hx.    Pulmonary neg pulmonary ROS, sleep apnea    Pulmonary exam normal breath sounds clear to auscultation       Cardiovascular Exercise Tolerance: Good hypertension, negative cardio ROS  Rhythm:regular Rate:Normal + Diastolic murmurs    Neuro/Psych negative neurological ROS  negative psych ROS   GI/Hepatic negative GI ROS, Neg liver ROS,GERD  ,,  Endo/Other  negative endocrine ROS    Renal/GU negative Renal ROS  negative genitourinary   Musculoskeletal   Abdominal   Peds  Hematology negative hematology ROS (+)   Anesthesia Other Findings   Reproductive/Obstetrics negative OB ROS                             Anesthesia Physical Anesthesia Plan  ASA: 2  Anesthesia Plan: General and General ETT   Post-op Pain Management:    Induction:   PONV Risk Score and Plan: Ondansetron  Airway Management Planned:   Additional Equipment:   Intra-op Plan:   Post-operative Plan:   Informed Consent: I have reviewed the patients History and Physical, chart, labs and discussed the procedure including the risks, benefits and alternatives for the proposed anesthesia with the patient or authorized representative who has indicated his/her understanding and acceptance.     Dental Advisory Given  Plan Discussed with: CRNA  Anesthesia Plan Comments:        Anesthesia Quick Evaluation

## 2022-10-24 NOTE — Anesthesia Procedure Notes (Addendum)
Procedure Name: Intubation Date/Time: 10/24/2022 11:47 AM  Performed by: Darryl Nestle, CRNAPre-anesthesia Checklist: Patient identified, Emergency Drugs available, Suction available and Patient being monitored Patient Re-evaluated:Patient Re-evaluated prior to induction Oxygen Delivery Method: Circle system utilized Preoxygenation: Pre-oxygenation with 100% oxygen Induction Type: IV induction Ventilation: Mask ventilation with difficulty, Two handed mask ventilation required and Oral airway inserted - appropriate to patient size Laryngoscope Size: Mac and 4 Grade View: Grade III Tube type: Oral Tube size: 7.0 mm Number of attempts: 2 Airway Equipment and Method: Stylet and Oral airway Placement Confirmation: ETT inserted through vocal cords under direct vision, positive ETCO2 and breath sounds checked- equal and bilateral Secured at: 23 cm Tube secured with: Tape Dental Injury: Teeth and Oropharynx as per pre-operative assessment  Difficulty Due To: Difficulty was anticipated Comments: Have glidescope in room

## 2022-10-24 NOTE — Transfer of Care (Signed)
Immediate Anesthesia Transfer of Care Note  Patient: Marvin Rivera  Procedure(s) Performed: XI ROBOTIC ASSISTED LAPAROSCOPIC CHOLECYSTECTOMY (Abdomen)  Patient Location: PACU  Anesthesia Type:General  Level of Consciousness: awake, alert , and oriented  Airway & Oxygen Therapy: Patient Spontanous Breathing and Patient connected to nasal cannula oxygen  Post-op Assessment: Report given to RN and Post -op Vital signs reviewed and stable  Post vital signs: Reviewed and stable  Last Vitals:  Vitals Value Taken Time  BP 150/80   Temp 98   Pulse 110   Resp 12 10/24/22 1311  SpO2 98   Vitals shown include unvalidated device data.  Last Pain:  Vitals:   10/24/22 1052  TempSrc:   PainSc: 0-No pain         Complications:  Encounter Notable Events  Notable Event Outcome Phase Comment  Difficult to intubate - expected  Intraprocedure Filed from anesthesia note documentation.

## 2022-10-27 LAB — SURGICAL PATHOLOGY

## 2022-10-27 NOTE — Anesthesia Postprocedure Evaluation (Signed)
Anesthesia Post Note  Patient: Marvin Rivera  Procedure(s) Performed: XI ROBOTIC ASSISTED LAPAROSCOPIC CHOLECYSTECTOMY (Abdomen)  Patient location during evaluation: Phase II Anesthesia Type: General Level of consciousness: awake Pain management: pain level controlled Vital Signs Assessment: post-procedure vital signs reviewed and stable Respiratory status: spontaneous breathing and respiratory function stable Cardiovascular status: blood pressure returned to baseline and stable Postop Assessment: no headache and no apparent nausea or vomiting Anesthetic complications: yes Comments: Late entry   Encounter Notable Events  Notable Event Outcome Phase Comment  Difficult to intubate - expected  Intraprocedure Filed from anesthesia note documentation.     Last Vitals:  Vitals:   10/24/22 1400 10/24/22 1405  BP: (!) 171/94 (!) 161/91  Pulse: 84 85  Resp:    Temp:  36.5 C  SpO2: 97% 98%    Last Pain:  Vitals:   10/24/22 1405  TempSrc: Oral  PainSc: 5                  Windell Norfolk

## 2022-10-28 ENCOUNTER — Ambulatory Visit: Payer: BC Managed Care – PPO | Admitting: Gastroenterology

## 2022-10-29 ENCOUNTER — Telehealth: Payer: Self-pay | Admitting: *Deleted

## 2022-10-29 ENCOUNTER — Ambulatory Visit (INDEPENDENT_AMBULATORY_CARE_PROVIDER_SITE_OTHER): Payer: BC Managed Care – PPO | Admitting: General Surgery

## 2022-10-29 DIAGNOSIS — Z09 Encounter for follow-up examination after completed treatment for conditions other than malignant neoplasm: Secondary | ICD-10-CM

## 2022-10-29 NOTE — Progress Notes (Signed)
Attempted telephone postoperative visit.  Unable to reach patient.  Unable to leave message due to mailbox being full.

## 2022-10-29 NOTE — Telephone Encounter (Signed)
Surgical Date: 10/24/2022 Procedure: XI ROBOTIC ASSISTED LAPAROSCOPIC CHOLECYSTECTOMY   Received call from patient [phone]~ telephone.   Patient reports that he missed earlier call from Dr. Lovell Sheehan. States that he is healing well and has no concerns.

## 2022-11-06 ENCOUNTER — Encounter: Payer: Self-pay | Admitting: *Deleted

## 2022-11-27 ENCOUNTER — Inpatient Hospital Stay: Payer: BC Managed Care – PPO | Attending: Family Medicine | Admitting: Genetic Counselor

## 2022-11-27 ENCOUNTER — Inpatient Hospital Stay: Payer: BC Managed Care – PPO

## 2022-11-27 ENCOUNTER — Other Ambulatory Visit: Payer: Self-pay

## 2022-11-27 DIAGNOSIS — Z8481 Family history of carrier of genetic disease: Secondary | ICD-10-CM

## 2022-11-27 LAB — GENETIC SCREENING ORDER

## 2022-11-28 ENCOUNTER — Encounter: Payer: Self-pay | Admitting: Genetic Counselor

## 2022-11-28 NOTE — Progress Notes (Signed)
REFERRING PROVIDER: Babs Sciara, MD [address] [place],  [place] [address]  PRIMARY PROVIDER:  Babs Sciara, MD  PRIMARY REASON FOR VISIT:  1. Family history of carrier of genetic disease    HISTORY OF PRESENT ILLNESS:   Mr. Jaquith, a 32 y.o. male, was seen for a [place] cancer genetics consultation at the request of Dr. Gerda Diss due to a family history of Carney Complex.  Mr. Easler presents to clinic today to discuss the possibility of a hereditary predisposition to cancer, to discuss genetic testing, and to further clarify his future cancer risks, as well as potential cancer risks for family members.   Mr. Pollan is a 32 y.o. male with no personal history of cancer.    Past Medical History:  Diagnosis Date   Gallstones    Hypertension    Liver cyst 2009   Sleep apnea     No past surgical history on file.  Social History   Socioeconomic History   Marital status: Married    Spouse name: Not on file   Number of children: 3   Years of education: Not on file   Highest education level: Not on file  Occupational History   Not on file  Tobacco Use   Smoking status: Never   Smokeless tobacco: Never  Vaping Use   Vaping status: Never Used  Substance and Sexual Activity   Alcohol use: Yes    Comment: rarely   Drug use: No   Sexual activity: Yes  Other Topics Concern   Not on file  Social History Narrative   Not on file   Social Determinants of Health   Financial Resource Strain: Not on file  Food Insecurity: Not on file  Transportation Needs: Not on file  Physical Activity: Not on file  Stress: Not on file  Social Connections: Not on file     FAMILY HISTORY:  We obtained a detailed, 4-generation family history.  Significant diagnoses are listed below: Family History  Problem Relation Age of Onset   Lung cancer Mother 18   Atrial fibrillation Mother    Hypertension Mother    Atrial fibrillation Father    Sleep apnea Brother    Other Paternal  Aunt        tumor (unspecified location)   Other Paternal Aunt        cardiac tumor (likely myxoma), carney complex   Other Paternal Uncle        cardiac tumor (likely myxoma)   Heart disease Paternal Grandmother        cardiac tumor (likely myxoma)   Other Paternal Grandmother        carney complex   Lung cancer Paternal Grandfather    Other Cousin        pituitary adenoma, carney complex, paternal first couin once removed     Mr. Winchell reports a paternal family history of Carney Complex, but he is unable to obtain family member results to confirm. Mr. Bellucci has 4 paternal aunts, 1 has a tumor (unspecified location) and 1 has a cardiac tumor (likely a myxoma) and she had positive genetic testing for Carney Complex. He has 4 paternal uncles, 1 has a cardiac tumor (likely a myxoma). One paternal first cousin once removed had a pituitary adenoma and positive genetic testing for Carney Complex. Mr Magnone's paternal grandmother had a cardiac tumor (likely a myxoma), she died at age 88. His paternal grandfather was diagnosed with lung cancer, he smoked and worked  in coal mines, he died at age 45. Mr. Nuon mother was diagnosed with lung cancer at age 91, she did not smoke and reportedly had positive genetic testing for the ALK gene (unknown if germline or somatic), she died at age 59. There is no reported Ashkenazi Jewish ancestry.   GENETIC COUNSELING ASSESSMENT: Mr. Kohles is a 32 y.o. male with a reported family history of Carney Complex. We, therefore, discussed and recommended the following at today's visit.   DISCUSSION: We discussed that 5 - 10% of cancer is hereditary. We discussed that testing is beneficial for several reasons, including knowing about other cancer risks, identifying potential screening and risk-reduction options that may be appropriate, and to understanding if other family members could be at risk for cancer and allowing them to undergo genetic testing.  We reviewed the  characteristics, features and inheritance patterns of hereditary cancer syndromes. We spent the majority of the session walking through the risk and management recommendations for individuals with Carney Complex. We also discussed genetic testing, including the appropriate family members to test, the process of testing, insurance coverage and turn-around-time for results. We discussed the implications of a negative, positive, carrier and/or variant of uncertain significant result.   Mr. Fawaz was offered a common hereditary cancer panel (34 genes+ PRKAR1A and ALK) and an expanded pan-cancer panel (71 genes). Mr. Woodbury was informed of the benefits and limitations of each panel, including that expanded pan-cancer panels contain several genes that do not have clear management guidelines at this point in time. We also discussed that as the number of genes included on a panel increases, the chances of variants of uncertain significance increases.  After considering the benefits and limitations of each gene panel, Mr. Tooker elected to have [facility] CancerNext-Expanded Panel.   The CancerNext-Expanded gene panel offered by [facility] and includes sequencing, rearrangement, and RNA analysis for the following 71 genes: AIP, ALK, APC, ATM, AXIN2, BAP1, BARD1, BMPR1A, BRCA1, BRCA2, BRIP1, CDC73, CDH1, CDK4, CDKN1B, CDKN2A, CHEK2, CTNNA1, DICER1, FH, FLCN, KIF1B, LZTR1, MAX, MEN1, MET, MLH1, MSH2, MSH3, MSH6, MUTYH, NF1, NF2, NTHL1, PALB2, PHOX2B, PMS2, POT1, PRKAR1A, PTCH1, PTEN, RAD51C, RAD51D, RB1, RET, SDHA, SDHAF2, SDHB, SDHC, SDHD, SMAD4, SMARCA4, SMARCB1, SMARCE1, STK11, SUFU, TMEM127, TP53, TSC1, TSC2, and VHL (sequencing and deletion/duplication); EGFR, EGLN1, HOXB13, KIT, MITF, PDGFRA, POLD1, and POLE (sequencing only); EPCAM and GREM1 (deletion/duplication only).   Based on Mr. Filyaw's family history of Carney Complex, he meets medical criteria for genetic testing. Despite that he meets criteria, he may still  have an out of pocket cost. We discussed that if his out of pocket cost for testing is over $100, the laboratory will call and confirm whether he wants to proceed with testing.  If the out of pocket cost of testing is less than $100 he will be billed by the genetic testing laboratory.   We discussed that some people do not want to undergo genetic testing due to fear of genetic discrimination.  A federal law called the Genetic Information Non-Discrimination Act (GINA) of 2008 helps protect individuals against genetic discrimination based on their genetic test results.  It impacts both health insurance and employment.  With health insurance, it protects against increased premiums, being kicked off insurance or being forced to take a test in order to be insured.  For employment it protects against hiring, firing and promoting decisions based on genetic test results.  GINA does not apply to those in the [employer], those who work for companies with less  than 15 employees, and new life insurance or long-term disability Medical illustrator.  Health status due to a cancer diagnosis is not protected under GINA.  PLAN: After considering the risks, benefits, and limitations, Mr. Alfieri provided informed consent to pursue genetic testing and the blood sample was sent to [facility] for analysis of the CancerNext-Expanded Panel. Results should be available within approximately 2-3 weeks' time, at which point they will be disclosed by telephone to Mr. Rawlinson, as will any additional recommendations warranted by these results. Mr. Deavers will receive a summary of his genetic counseling visit and a copy of his results once available. This information will also be available in Epic.   Mr. Wessinger questions were answered to his satisfaction today. Our contact information was provided should additional questions or concerns arise. Thank you for the referral and allowing [place] to share in the care of your patient.   Lalla Brothers, MS, [facility] Genetic Counselor [email or web address] [phone]  The patient was seen for a total of 40 minutes in face-to-face genetic counseling.  The patient was seen alone.  Drs. Pamelia Hoit and/or Mosetta Putt were available to discuss this case as needed.  _______________________________________________________________________ For Office Staff:  Number of people involved in session: 1 Was an Intern/ student involved with case: no

## 2022-11-30 ENCOUNTER — Ambulatory Visit
Admission: RE | Admit: 2022-11-30 | Discharge: 2022-11-30 | Disposition: A | Discharge status: Home / Self Care | Payer: BC Managed Care – PPO | Source: Ambulatory Visit | Attending: Nurse Practitioner | Admitting: Nurse Practitioner

## 2022-11-30 VITALS — BP 128/80 | HR 73 | Temp 98.2°F | Resp 18

## 2022-11-30 DIAGNOSIS — J069 Acute upper respiratory infection, unspecified: Secondary | ICD-10-CM | POA: Insufficient documentation

## 2022-11-30 DIAGNOSIS — Z1152 Encounter for screening for COVID-19: Secondary | ICD-10-CM | POA: Diagnosis present

## 2022-11-30 LAB — POCT INFLUENZA A/B
Influenza A, POC: NEGATIVE
Influenza B, POC: NEGATIVE

## 2022-11-30 MED ORDER — PROMETHAZINE-DM 6.25-15 MG/5ML PO SYRP: 5.0000 mL | ORAL_SOLUTION | Freq: Four times a day (QID) | ORAL | 0 refills | Status: DC | PRN

## 2022-11-30 MED ORDER — FLUTICASONE PROPIONATE 50 MCG/ACT NA SUSP: 2.0000 | Freq: Every day | NASAL | 0 refills | Status: DC

## 2022-11-30 NOTE — ED Triage Notes (Signed)
Chest congestion since Friday.  Fever this morning.  Productive cough with green sputum.

## 2022-11-30 NOTE — ED Provider Notes (Signed)
[facility]    CSN: [id number] Arrival date & time: 11/30/22  1252      History   Chief Complaint Chief Complaint  Patient presents with   Cough    Cough, fever, body aches, chest congestion - Entered by patient    HPI Marvin Rivera is a 32 y.o. male.   The history is provided by the patient.   The patient presents for complaints of fever, body aches, cough, and chest congestion.  Patient states symptoms started approximately 2 days ago.  Fever started this morning.  Tmax 104.  Patient denies headache, nasal congestion, runny nose, wheezing, shortness of breath, difficulty breathing, abdominal pain, nausea, vomiting, or diarrhea.  Patient reports that he has been taking Tylenol and Mucinex for his symptoms.  He states his wife was sick with bronchitis last week.  Patient denies history of asthma.  Past Medical History:  Diagnosis Date   Gallstones    Hypertension    Liver cyst 2009   Sleep apnea     Patient Active Problem List   Diagnosis Date Noted   Calculus of gallbladder without cholecystitis without obstruction 10/24/2022   Chest pain 10/06/2020   Chronic cholecystitis with calculus 10/06/2020   Hypertension    Class 3 obesity     Hyperglycemia    Gastroesophageal reflux disease without esophagitis 09/13/2017    History reviewed. No pertinent surgical history.     Home Medications    Prior to Admission medications   Medication Sig Start Date End Date Taking? Authorizing Provider  fluticasone (FLONASE) 50 MCG/ACT nasal spray Place 2 sprays into both nostrils daily. 11/30/22  Yes Torez Beauregard-Warren, Sadie Haber, NP  promethazine-dextromethorphan (PROMETHAZINE-DM) 6.25-15 MG/5ML syrup Take 5 mLs by mouth 4 (four) times daily as needed. 11/30/22  Yes Earline Stiner-Warren, Sadie Haber, NP    Family History Family History  Problem Relation Age of Onset   Lung cancer Mother 72   Atrial fibrillation Mother    Hypertension Mother    Atrial fibrillation Father     Sleep apnea Brother    Other Paternal Aunt        tumor (unspecified location)   Other Paternal Aunt        cardiac tumor (likely myxoma), carney complex   Other Paternal Uncle        cardiac tumor (likely myxoma)   Heart disease Paternal Grandmother        cardiac tumor (likely myxoma)   Other Paternal Grandmother        carney complex   Lung cancer Paternal Grandfather    Other Cousin        pituitary adenoma, carney complex, paternal first couin once removed    Social History Social History   Tobacco Use   Smoking status: Never   Smokeless tobacco: Never  Vaping Use   Vaping status: Never Used  Substance Use Topics   Alcohol use: Yes    Comment: rarely   Drug use: No     Allergies   Bee venom   Review of Systems Review of Systems Per HPI  Physical Exam Triage Vital Signs ED Triage Vitals  Encounter Vitals Group     BP 11/30/22 1302 128/80     Systolic BP Percentile --      Diastolic BP Percentile --      Pulse Rate 11/30/22 1302 73     Resp 11/30/22 1302 18     Temp 11/30/22 1302 98.2 F (36.8 C)  Temp Source 11/30/22 1302 Oral     SpO2 11/30/22 1302 93 %     Weight --      Height --      Head Circumference --      Peak Flow --      Pain Score 11/30/22 1303 0     Pain Loc --      Pain Education --      Exclude from Growth Chart --    No data found.  Updated Vital Signs BP 128/80 (BP Location: Right Arm)   Pulse 73   Temp 98.2 F (36.8 C) (Oral)   Resp 18   SpO2 93%   Visual Acuity Right Eye Distance:   Left Eye Distance:   Bilateral Distance:    Right Eye Near:   Left Eye Near:    Bilateral Near:     Physical Exam Vitals and nursing note reviewed.  Constitutional:      General: He is not in acute distress.    Appearance: Normal appearance.  HENT:     Head: Normocephalic.     Right Ear: Tympanic membrane, ear canal and external ear normal.     Left Ear: Tympanic membrane, ear canal and external ear normal.     Nose:  Congestion present.     Right Turbinates: Enlarged and swollen.     Left Turbinates: Enlarged and swollen.     Right Sinus: No maxillary sinus tenderness or frontal sinus tenderness.     Left Sinus: No maxillary sinus tenderness or frontal sinus tenderness.     Mouth/Throat:     Lips: Pink.     Mouth: Mucous membranes are moist.     Pharynx: Oropharynx is clear. Uvula midline. No pharyngeal swelling or posterior oropharyngeal erythema.  Eyes:     Extraocular Movements: Extraocular movements intact.     Conjunctiva/sclera: Conjunctivae normal.     Pupils: Pupils are equal, round, and reactive to light.  Cardiovascular:     Rate and Rhythm: Normal rate and regular rhythm.     Pulses: Normal pulses.     Heart sounds: Normal heart sounds.  Pulmonary:     Effort: Pulmonary effort is normal. No respiratory distress.     Breath sounds: Normal breath sounds. No stridor. No wheezing, rhonchi or rales.  Abdominal:     General: Bowel sounds are normal.     Palpations: Abdomen is soft.     Tenderness: There is no abdominal tenderness.  Musculoskeletal:     Cervical back: Normal range of motion.  Lymphadenopathy:     Cervical: No cervical adenopathy.  Skin:    General: Skin is warm and dry.  Neurological:     General: No focal deficit present.     Mental Status: He is alert and oriented to person, place, and time.  Psychiatric:        Mood and Affect: Mood normal.        Behavior: Behavior normal.      UC Treatments / Results  Labs (all labs ordered are listed, but only abnormal results are displayed) Labs Reviewed  SARS CORONAVIRUS 2 (TAT 6-24 HRS)  POCT INFLUENZA A/B    EKG   Radiology No results found.  Procedures Procedures (including critical care time)  Medications Ordered in UC Medications - No data to display  Initial Impression / Assessment and Plan / UC Course  I have reviewed the triage vital signs and the nursing notes.  Pertinent labs & imaging results  that were available during my care of the patient were reviewed by me and considered in my medical decision making (see chart for details).  The patient is well-appearing, he is in no acute distress, vital signs are stable.  Influenza test is negative.  COVID test is pending.  Patient is a candidate to receive Paxlovid if his COVID test is positive.  Will treat patient symptomatically for viral upper respiratory infection with cough with Promethazine DM for his cough, and fluticasone 50 micro nasal spray for his nasal congestion and runny nose.  Supportive care recommendations were provided and discussed with the patient to include increasing fluids, allowing for plenty of rest, normal saline nasal spray for nasal congestion and runny nose, and use of a humidifier in his bedroom at nighttime during sleep for cough.  Patient was advised to follow-up immediately if he begins to experience a fever that he cannot control with Tylenol or ibuprofen, or if he has a worsening cough.  Patient is in agreement with this plan of care and verbalizes understanding.  All questions were answered.  Patient stable for discharge.  Work note was provided.  Final Clinical Impressions(s) / UC Diagnoses   Final diagnoses:  Viral upper respiratory tract infection with cough  Encounter for screening for COVID-19     Discharge Instructions      The influenza test was negative. Take medication as prescribed. Increase fluids and allow for plenty of rest. Continue Tylenol or ibuprofen as needed for pain, fever, or general discomfort. Recommend using a humidifier at bedtime during sleep to help with cough and nasal congestion. Sleep elevated on 2 pillows. If you experience a fever with worsening cough, shortness of breath, difficulty breathing, or other concerns, please follow-up in this clinic or in the emergency department immediately for further evaluation. As discussed, symptoms are most likely due to a viral  infection.  Symptoms can last from 7 to 10 days.  If symptoms are suddenly worsening, or failing to improve, please follow-up in this clinic or with your primary care physician for further evaluation. Follow-up as needed.     ED Prescriptions     Medication Sig Dispense Auth. Provider   fluticasone (FLONASE) 50 MCG/ACT nasal spray Place 2 sprays into both nostrils daily. 16 g Nickolus Wadding-Warren, Sadie Haber, NP   promethazine-dextromethorphan (PROMETHAZINE-DM) 6.25-15 MG/5ML syrup Take 5 mLs by mouth 4 (four) times daily as needed. 118 mL Itzae Miralles-Warren, Sadie Haber, NP      PDMP not reviewed this encounter.   Abran Cantor, NP 11/30/22 1347

## 2022-11-30 NOTE — Discharge Instructions (Signed)
The influenza test was negative. Take medication as prescribed. Increase fluids and allow for plenty of rest. Continue Tylenol or ibuprofen as needed for pain, fever, or general discomfort. Recommend using a humidifier at bedtime during sleep to help with cough and nasal congestion. Sleep elevated on 2 pillows. If you experience a fever with worsening cough, shortness of breath, difficulty breathing, or other concerns, please follow-up in this clinic or in the emergency department immediately for further evaluation. As discussed, symptoms are most likely due to a viral infection.  Symptoms can last from 7 to 10 days.  If symptoms are suddenly worsening, or failing to improve, please follow-up in this clinic or with your primary care physician for further evaluation. Follow-up as needed.

## 2022-12-01 ENCOUNTER — Telehealth: Payer: Self-pay

## 2022-12-01 ENCOUNTER — Ambulatory Visit: Payer: Self-pay

## 2022-12-01 NOTE — Telephone Encounter (Signed)
Pts wife called and said her husband was not feeling any better since their visit at [facility] yesterday. Stated his O2 is 47, he is coughing up mucus, and has crackles in his chest. Husband wanted prednisone prescription sent in, but per provider, he would have to be re seen to get a different prescription. Pts's wife Gavin Pound verbalized understanding.   Pts wife and Patient  identified using two identifiers.

## 2022-12-04 ENCOUNTER — Ambulatory Visit (HOSPITAL_COMMUNITY)
Admission: RE | Admit: 2022-12-04 | Discharge: 2022-12-04 | Disposition: A | Discharge status: Home / Self Care | Payer: BC Managed Care – PPO | Source: Ambulatory Visit | Attending: Family Medicine | Admitting: Family Medicine

## 2022-12-04 ENCOUNTER — Ambulatory Visit (INDEPENDENT_AMBULATORY_CARE_PROVIDER_SITE_OTHER): Payer: BC Managed Care – PPO | Admitting: Family Medicine

## 2022-12-04 VITALS — BP 145/85 | HR 86 | Temp 97.2°F | Ht 70.0 in

## 2022-12-04 DIAGNOSIS — R051 Acute cough: Secondary | ICD-10-CM | POA: Diagnosis present

## 2022-12-04 DIAGNOSIS — M109 Gout, unspecified: Secondary | ICD-10-CM | POA: Diagnosis not present

## 2022-12-04 MED ORDER — AZITHROMYCIN 250 MG PO TABS: ORAL_TABLET | ORAL | 0 refills | Status: AC

## 2022-12-04 MED ORDER — PREDNISONE 10 MG PO TABS: ORAL_TABLET | ORAL | 0 refills | Status: DC

## 2022-12-04 MED ORDER — AMOXICILLIN-POT CLAVULANATE 875-125 MG PO TABS
1.0000 | ORAL_TABLET | Freq: Two times a day (BID) | ORAL | 0 refills | Status: DC
Start: 2022-12-04 — End: 2022-12-24

## 2022-12-04 NOTE — Assessment & Plan Note (Signed)
Treating with prednisone. 

## 2022-12-04 NOTE — Assessment & Plan Note (Signed)
Chest x-ray was obtained and was independently reviewed by me.  Based on the images, I am concerned about left lower lobe pneumonia although the radiologist felt like this with atelectasis.  I am covering for pneumonia by placing him on dual antibiotic therapy.

## 2022-12-04 NOTE — Progress Notes (Signed)
Subjective:  Patient ID: Marvin Rivera, male    DOB: Dec 01, 1990  Age: 32 y.o. MRN: [id number]  CC: Respiratory symptoms and gout   HPI:  32 year old male presents for evaluation of the above  Patient significant other has recently been sick.  He subsequently got sick on Friday.  Patient reports that he has had prior to cough, wheeze, congestion.  He is also had fever, Tmax 104.3.  Went to urgent care on 8/4.  Negative flu testing.  Has had negative COVID testing as well.  Continues to be symptomatic and feels very poorly.  Additionally, yesterday developed severe left ankle pain and swelling.  Endorses a history of gout.  Painful ambulation.  Patient Active Problem List   Diagnosis Date Noted   Acute cough 12/04/2022   Gout attack 12/04/2022   Hypertension    Class 3 obesity     Gastroesophageal reflux disease without esophagitis 09/13/2017    Social Hx   Social History   Socioeconomic History   Marital status: Married    Spouse name: Not on file   Number of children: 3   Years of education: Not on file   Highest education level: Not on file  Occupational History   Not on file  Tobacco Use   Smoking status: Never   Smokeless tobacco: Never  Vaping Use   Vaping status: Never Used  Substance and Sexual Activity   Alcohol use: Yes    Comment: rarely   Drug use: No   Sexual activity: Yes  Other Topics Concern   Not on file  Social History Narrative   Not on file   Social Determinants of Health   Financial Resource Strain: Not on file  Food Insecurity: Not on file  Transportation Needs: Not on file  Physical Activity: Not on file  Stress: Not on file  Social Connections: Not on file    Review of Systems Per HPI  Objective:  BP (!) 145/85   Pulse 86   Temp (!) 97.2 F (36.2 C)   Ht 5\' 10"  (1.778 m)   SpO2 96%   BMI 38.60 kg/m      12/04/2022   11:16 AM 11/30/2022    1:02 PM 10/24/2022    2:05 PM  BP/Weight  Systolic BP 145 128 161  Diastolic BP  85 80 91    Physical Exam Vitals and nursing note reviewed.  Constitutional:      General: He is not in acute distress.    Appearance: He is obese.  HENT:     Head: Normocephalic and atraumatic.  Eyes:     General:        Right eye: No discharge.        Left eye: No discharge.     Conjunctiva/sclera: Conjunctivae normal.  Cardiovascular:     Rate and Rhythm: Normal rate and regular rhythm.  Pulmonary:     Effort: Pulmonary effort is normal.     Breath sounds: No wheezing or rales.  Musculoskeletal:     Comments: Left ankle -swelling and erythematous.  Exquisitely tender to palpation.  Neurological:     Mental Status: He is alert.  Psychiatric:        Mood and Affect: Mood normal.        Behavior: Behavior normal.     Lab Results  Component Value Date   WBC 6.2 10/13/2022   HGB 13.6 10/13/2022   HCT 40.3 10/13/2022   PLT 308 10/13/2022   GLUCOSE 102 (  H) 10/13/2022   CHOL 199 10/13/2022   TRIG 157 (H) 10/13/2022   HDL 36 (L) 10/13/2022   LDLCALC 135 (H) 10/13/2022   ALT 41 10/13/2022   AST 50 (H) 10/13/2022   NA 138 10/13/2022   K 4.1 10/13/2022   CL 102 10/13/2022   CREATININE 1.01 10/13/2022   BUN 13 10/13/2022   CO2 21 10/13/2022   TSH 2.150 10/13/2022   HGBA1C 5.9 (H) 10/13/2022     Assessment & Plan:   Problem List Items Addressed This Visit       Other   Gout attack    Treating with prednisone.      Acute cough - Primary    Chest x-ray was obtained and was independently reviewed by me.  Based on the images, I am concerned about left lower lobe pneumonia although the radiologist felt like this with atelectasis.  I am covering for pneumonia by placing him on dual antibiotic therapy.       Relevant Orders   DG Chest 2 View (Completed)    Meds ordered this encounter  Medications   predniSONE (DELTASONE) 10 MG tablet    Sig: 50 mg daily x 3 days, then 40 mg daily x 3 days, then 30 mg daily x 3 days, then 20 mg daily x 3 days, then 10 mg daily  x 3 days.    Dispense:  45 tablet    Refill:  0   amoxicillin-clavulanate (AUGMENTIN) 875-125 MG tablet    Sig: Take 1 tablet by mouth 2 (two) times daily.    Dispense:  14 tablet    Refill:  0   azithromycin (ZITHROMAX) 250 MG tablet    Sig: Take 2 tablets on day 1, then 1 tablet daily on days 2 through 5    Dispense:  6 tablet    Refill:  0    Follow-up:  Return if symptoms worsen or fail to improve.  Everlene Other DO [facility]

## 2022-12-04 NOTE — Patient Instructions (Signed)
Chest x-ray today.  Go ahead and stop the prednisone.  I will send in antibiotics once I see your chest x-ray.

## 2022-12-10 ENCOUNTER — Telehealth: Payer: Self-pay | Admitting: Genetic Counselor

## 2022-12-10 ENCOUNTER — Encounter: Payer: Self-pay | Admitting: Genetic Counselor

## 2022-12-10 DIAGNOSIS — Z1379 Encounter for other screening for genetic and chromosomal anomalies: Secondary | ICD-10-CM | POA: Insufficient documentation

## 2022-12-10 NOTE — Telephone Encounter (Addendum)
I contacted Mr. Louallen to discuss his genetic testing results. No pathogenic variants were identified in the 71 genes analyzed. Detailed clinic note to follow.  The test report has been scanned into EPIC and is located under the Molecular Pathology section of the Results Review tab.  A portion of the result report is included below for reference.   Lalla Brothers, MS, [facility] [place] Genetic Counselor [email or web address] (P) [phone]

## 2022-12-13 ENCOUNTER — Encounter (HOSPITAL_COMMUNITY): Payer: Self-pay | Admitting: Emergency Medicine

## 2022-12-13 ENCOUNTER — Other Ambulatory Visit: Payer: Self-pay

## 2022-12-13 ENCOUNTER — Emergency Department (HOSPITAL_COMMUNITY)
Admission: EM | Admit: 2022-12-13 | Discharge: 2022-12-13 | Disposition: A | Discharge status: Home / Self Care | Payer: BC Managed Care – PPO | Attending: Emergency Medicine | Admitting: Emergency Medicine

## 2022-12-13 ENCOUNTER — Emergency Department (HOSPITAL_COMMUNITY): Payer: BC Managed Care – PPO

## 2022-12-13 DIAGNOSIS — K625 Hemorrhage of anus and rectum: Secondary | ICD-10-CM | POA: Diagnosis not present

## 2022-12-13 DIAGNOSIS — R109 Unspecified abdominal pain: Secondary | ICD-10-CM | POA: Diagnosis present

## 2022-12-13 LAB — CBC
HCT: 40.6 % (ref 39.0–52.0)
Hemoglobin: 13.8 g/dL (ref 13.0–17.0)
MCH: 30.1 pg (ref 26.0–34.0)
MCHC: 34 g/dL (ref 30.0–36.0)
MCV: 88.5 fL (ref 80.0–100.0)
Platelets: 381 10*3/uL (ref 150–400)
RBC: 4.59 MIL/uL (ref 4.22–5.81)
RDW: 13.6 % (ref 11.5–15.5)
WBC: 13.5 10*3/uL — ABNORMAL HIGH (ref 4.0–10.5)
nRBC: 0 % (ref 0.0–0.2)

## 2022-12-13 LAB — COMPREHENSIVE METABOLIC PANEL
ALT: 45 U/L — ABNORMAL HIGH (ref 0–44)
AST: 26 U/L (ref 15–41)
Albumin: 4.2 g/dL (ref 3.5–5.0)
Alkaline Phosphatase: 59 U/L (ref 38–126)
Anion gap: 12 (ref 5–15)
BUN: 16 mg/dL (ref 6–20)
CO2: 21 mmol/L — ABNORMAL LOW (ref 22–32)
Calcium: 9.1 mg/dL (ref 8.9–10.3)
Chloride: 102 mmol/L (ref 98–111)
Creatinine, Ser: 0.96 mg/dL (ref 0.61–1.24)
GFR, Estimated: >60 mL/min (ref >60)
Glucose, Bld: 107 mg/dL — ABNORMAL HIGH (ref 70–99)
Potassium: 3.6 mmol/L (ref 3.5–5.1)
Sodium: 135 mmol/L (ref 135–145)
Total Bilirubin: 1 mg/dL (ref 0.3–1.2)
Total Protein: 7 g/dL (ref 6.5–8.1)

## 2022-12-13 LAB — LIPASE, BLOOD: Lipase: 68 U/L — ABNORMAL HIGH (ref 11–51)

## 2022-12-13 MED ORDER — IOHEXOL 350 MG/ML SOLN
125.0000 mL | Freq: Once | INTRAVENOUS | Status: AC | PRN
  Administered 2022-12-13: 125 mL via INTRAVENOUS

## 2022-12-13 MED ORDER — SODIUM CHLORIDE 0.9 % IV BOLUS
1000.0000 mL | Freq: Once | INTRAVENOUS | Status: AC
  Administered 2022-12-13: 1000 mL via INTRAVENOUS

## 2022-12-13 MED ORDER — HYDROCORTISONE ACETATE 25 MG RE SUPP: 25.0000 mg | Freq: Two times a day (BID) | RECTAL | 0 refills | Status: DC

## 2022-12-13 NOTE — ED Notes (Signed)
Patient transported to CT 

## 2022-12-13 NOTE — ED Triage Notes (Signed)
Pt with c/o abdominal pain and when he had a BM tonight, he noticed some blood in his stool. Pt currently on ABX for Pneumonia.

## 2022-12-13 NOTE — ED Provider Notes (Signed)
[place] EMERGENCY DEPARTMENT AT [facility] Provider Note   CSN: [id number] Arrival date & time: 12/13/22  0023     History  Chief Complaint  Patient presents with   Abdominal Pain   Rectal Bleeding    Marvin Rivera is a 32 y.o. male.  Patient presents to the department for evaluation of abdominal pain.  He has been having intermittent sharp cramping in the lower central abdomen for 2 days.  This is followed by loose stools.  Today he has had a couple of bowel movements that had red blood mixed.  He just finished a course of amoxicillin for pneumonia, last dose yesterday.  He was also on prednisone.       Home Medications Prior to Admission medications   Medication Sig Start Date End Date Taking? Authorizing Provider  hydrocortisone (ANUSOL-HC) 25 MG suppository Place 1 suppository (25 mg total) rectally 2 (two) times daily. For 7 days 12/13/22  Yes Apollos Tenbrink, Canary Brim, MD  amoxicillin-clavulanate (AUGMENTIN) 875-125 MG tablet Take 1 tablet by mouth 2 (two) times daily. 12/04/22   Tommie Sams, DO  fluticasone (FLONASE) 50 MCG/ACT nasal spray Place 2 sprays into both nostrils daily. 11/30/22   Leath-Warren, Sadie Haber, NP  predniSONE (DELTASONE) 10 MG tablet 50 mg daily x 3 days, then 40 mg daily x 3 days, then 30 mg daily x 3 days, then 20 mg daily x 3 days, then 10 mg daily x 3 days. 12/04/22   Tommie Sams, DO  promethazine-dextromethorphan (PROMETHAZINE-DM) 6.25-15 MG/5ML syrup Take 5 mLs by mouth 4 (four) times daily as needed. 11/30/22   Leath-Warren, Sadie Haber, NP      Allergies    Bee venom    Review of Systems   Review of Systems  Physical Exam Updated Vital Signs BP (!) 158/94 (BP Location: Left Arm)   Pulse 90   Temp 98.2 F (36.8 C) (Oral)   Resp 18   Ht 5\' 10"  (1.778 m)   Wt (!) 161 kg   SpO2 97%   BMI 50.94 kg/m  Physical Exam Vitals and nursing note reviewed.  Constitutional:      General: He is not in acute distress.    Appearance:  He is well-developed.  HENT:     Head: Normocephalic and atraumatic.     Mouth/Throat:     Mouth: Mucous membranes are moist.  Eyes:     General: Vision grossly intact. Gaze aligned appropriately.     Extraocular Movements: Extraocular movements intact.     Conjunctiva/sclera: Conjunctivae normal.  Cardiovascular:     Rate and Rhythm: Normal rate and regular rhythm.     Pulses: Normal pulses.     Heart sounds: Normal heart sounds, S1 normal and S2 normal. No murmur heard.    No friction rub. No gallop.  Pulmonary:     Effort: Pulmonary effort is normal. No respiratory distress.     Breath sounds: Normal breath sounds.  Abdominal:     Palpations: Abdomen is soft.     Tenderness: There is no abdominal tenderness. There is no guarding or rebound.     Hernia: No hernia is present.  Musculoskeletal:        General: No swelling.     Cervical back: Full passive range of motion without pain, normal range of motion and neck supple. No pain with movement, spinous process tenderness or muscular tenderness. Normal range of motion.     Right lower leg: No edema.  Left lower leg: No edema.  Skin:    General: Skin is warm and dry.     Capillary Refill: Capillary refill takes less than 2 seconds.     Findings: No ecchymosis, erythema, lesion or wound.  Neurological:     Mental Status: He is alert and oriented to person, place, and time.     GCS: GCS eye subscore is 4. GCS verbal subscore is 5. GCS motor subscore is 6.     Cranial Nerves: Cranial nerves 2-12 are intact.     Sensory: Sensation is intact.     Motor: Motor function is intact. No weakness or abnormal muscle tone.     Coordination: Coordination is intact.  Psychiatric:        Mood and Affect: Mood normal.        Speech: Speech normal.        Behavior: Behavior normal.     ED Results / Procedures / Treatments   Labs (all labs ordered are listed, but only abnormal results are displayed) Labs Reviewed  LIPASE, BLOOD -  Abnormal; Notable for the following components:      Result Value   Lipase 68 (*)    All other components within normal limits  COMPREHENSIVE METABOLIC PANEL - Abnormal; Notable for the following components:   CO2 21 (*)    Glucose, Bld 107 (*)    ALT 45 (*)    All other components within normal limits  CBC - Abnormal; Notable for the following components:   WBC 13.5 (*)    All other components within normal limits  GASTROINTESTINAL PANEL BY PCR, STOOL (REPLACES STOOL CULTURE)  C DIFFICILE QUICK SCREEN W PCR REFLEX    URINALYSIS, ROUTINE W REFLEX MICROSCOPIC    EKG None  Radiology CT ABDOMEN PELVIS W CONTRAST  Result Date: 12/13/2022 CLINICAL DATA:  Abdominal pain after bowel movement tonight with blood in stool. EXAM: CT ABDOMEN AND PELVIS WITH CONTRAST TECHNIQUE: Multidetector CT imaging of the abdomen and pelvis was performed using the standard protocol following bolus administration of intravenous contrast. RADIATION DOSE REDUCTION: This exam was performed according to the departmental dose-optimization program which includes automated exposure control, adjustment of the mA and/or kV according to patient size and/or use of iterative reconstruction technique. CONTRAST:  OMNIPAQUE IOHEXOL 350 MG/ML SOLN COMPARISON:  CT abdomen and pelvis 05/12/2021 FINDINGS: Lower chest: No acute abnormality. Hepatobiliary: Mild hepatic steatosis. Cholecystectomy. No biliary dilation. Pancreas: Unremarkable. Spleen: Unremarkable. Adrenals/Urinary Tract: Normal adrenal glands. No urinary calculi or hydronephrosis. Unremarkable bladder. Stomach/Bowel: Normal caliber large and small bowel. Normal appendix. No bowel wall thickening. Stomach is within normal limits. Vascular/Lymphatic: No significant vascular findings are present. No enlarged abdominal or pelvic lymph nodes. Reproductive: Unremarkable. Other: No free intraperitoneal fluid or air. Musculoskeletal: No acute fracture. IMPRESSION: 1. No acute  abdominopelvic process. 2. Hepatic steatosis. Electronically Signed   By: Minerva Fester M.D.   On: 12/13/2022 01:36    Procedures Procedures    Medications Ordered in ED Medications  sodium chloride 0.9 % bolus 1,000 mL (1,000 mLs Intravenous New Bag/Given 12/13/22 0058)  iohexol (OMNIPAQUE) 350 MG/ML injection 125 mL (125 mLs Intravenous Contrast Given 12/13/22 0118)    ED Course/ Medical Decision Making/ A&P                                 Medical Decision Making Amount and/or Complexity of Data Reviewed Labs: ordered. Decision-making details  documented in ED Course. Radiology: ordered and independent interpretation performed. Decision-making details documented in ED Course.  Risk Prescription drug management.   Differential Diagnosis considered includes, but not limited to: Appendicitis; colitis; diverticulitis; bowel obstruction; cystitis; nephrolithiasis; pyelonephritis.   Presents to the emergency department for evaluation of lower abdominal/pelvic area cramping associated with loose stools followed by rectal bleeding.  Patient has been on amoxicillin for pneumonia, finished yesterday.  Blood work is normal.  Patient underwent CT scan to further evaluate.  No infectious or inflammatory changes, skin is normal.  Suspect that patient has internal hemorrhoidal bleeding secondary to the increased frequency of stool secondary to amoxicillin.  Doubt C. difficile.  Will discharge with Anusol HC suppository, follow-up with GI.        Final Clinical Impression(s) / ED Diagnoses Final diagnoses:  Rectal bleeding    Rx / DC Orders ED Discharge Orders          Ordered    Ambulatory referral to Gastroenterology        12/13/22 0151    hydrocortisone (ANUSOL-HC) 25 MG suppository  2 times daily        12/13/22 0151              Gilda Crease, MD 12/13/22 0151

## 2022-12-13 NOTE — ED Notes (Signed)
ED Provider at bedside. 

## 2022-12-15 ENCOUNTER — Encounter: Payer: Self-pay | Admitting: Genetic Counselor

## 2022-12-22 ENCOUNTER — Ambulatory Visit: Payer: Self-pay | Admitting: Genetic Counselor

## 2022-12-22 ENCOUNTER — Encounter: Payer: Self-pay | Admitting: Genetic Counselor

## 2022-12-22 DIAGNOSIS — Z1379 Encounter for other screening for genetic and chromosomal anomalies: Secondary | ICD-10-CM

## 2022-12-22 NOTE — Progress Notes (Signed)
HPI:   Marvin Rivera was previously seen in the [facility] due to a family history of Carney Complex. Please refer to our prior cancer genetics clinic note for more information regarding our discussion, assessment and recommendations, at the time. Marvin Rivera recent genetic test results were disclosed to him, as were recommendations warranted by these results. These results and recommendations are discussed in more detail below.  CANCER HISTORY:  Oncology History   No history exists.    FAMILY HISTORY:  We obtained a detailed, 4-generation family history.  Significant diagnoses are listed below: Family History  Problem Relation Age of Onset   Lung cancer Mother 32   Atrial fibrillation Mother    Hypertension Mother    Atrial fibrillation Father    Sleep apnea Brother    Other Paternal Aunt        tumor (unspecified location)   Other Paternal Aunt        cardiac tumor (likely myxoma), carney complex   Other Paternal Uncle        cardiac tumor (likely myxoma)   Heart disease Paternal Grandmother        cardiac tumor (likely myxoma)   Other Paternal Grandmother        carney complex   Lung cancer Paternal Grandfather    Other Cousin        pituitary adenoma, carney complex, paternal first couin once removed        Marvin Rivera reports a paternal family history of Carney Complex, but he is unable to obtain family member results to confirm. Marvin Rivera has 4 paternal aunts, 1 has a tumor (unspecified location) and 1 has a cardiac tumor (likely a myxoma) and she had positive genetic testing for Carney Complex. He has 4 paternal uncles, 1 has a cardiac tumor (likely a myxoma). One paternal first cousin once removed had a pituitary adenoma and positive genetic testing for Carney Complex. Marvin Rivera's paternal grandmother had a cardiac tumor (likely a myxoma), she died at age 22. His paternal grandfather was diagnosed with lung cancer, he smoked and worked in [employer], he died  at age 34. Marvin Rivera mother was diagnosed with lung cancer at age 77, she did not smoke and reportedly had positive genetic testing for the ALK gene (unknown if germline or somatic), she died at age 53. There is no reported Ashkenazi Jewish ancestry.    GENETIC TEST RESULTS:  The [facility] CancerNext-Expanded Panel found no pathogenic mutations.  The CancerNext-Expanded gene panel offered by [facility] and includes sequencing, rearrangement, and RNA analysis for the following 71 genes: AIP, ALK, APC, ATM, AXIN2, BAP1, BARD1, BMPR1A, BRCA1, BRCA2, BRIP1, CDC73, CDH1, CDK4, CDKN1B, CDKN2A, CHEK2, CTNNA1, DICER1, FH, FLCN, KIF1B, LZTR1, MAX, MEN1, MET, MLH1, MSH2, MSH3, MSH6, MUTYH, NF1, NF2, NTHL1, PALB2, PHOX2B, PMS2, POT1, PRKAR1A, PTCH1, PTEN, RAD51C, RAD51D, RB1, RET, SDHA, SDHAF2, SDHB, SDHC, SDHD, SMAD4, SMARCA4, SMARCB1, SMARCE1, STK11, SUFU, TMEM127, TP53, TSC1, TSC2, and VHL (sequencing and deletion/duplication); EGFR, EGLN1, HOXB13, KIT, MITF, PDGFRA, POLD1, and POLE (sequencing only); EPCAM and GREM1 (deletion/duplication only).    The test report has been scanned into EPIC and is located under the Molecular Pathology section of the Results Review tab.  A portion of the result report is included below for reference. Genetic testing reported out on 12/08/2022.       Even though a pathogenic variant was not identified, possible explanations for the cancer/tumors in the family may include: There may be no hereditary  risk for cancer in the family. The cancers/tumors in his family may be due to other genetic or environmental factors.  There may be a gene mutation in one of these genes that current testing methods cannot detect, but that chance is small. There could be another gene that has not yet been discovered, or that we have not yet tested, that is responsible for the cancer diagnoses in the family.  It is also possible there is a hereditary cause for the cancer/tumors in the family that  Marvin Rivera did not inherit. This is the most likely explanation, because he reports a family history of Carney Complex. However, we do not have a family report to confirm.  ADDITIONAL GENETIC TESTING:  We discussed with Marvin Rivera that his genetic testing was fairly extensive.  If there are genes identified to increase cancer risk that can be analyzed in the future, we would be happy to discuss and coordinate this testing at that time.    CANCER SCREENING RECOMMENDATIONS:  Marvin Rivera test result is considered negative (normal). An individual's cancer risk and medical management are not determined by genetic test results alone. Overall cancer risk assessment incorporates additional factors, including personal medical history, family history, and any available genetic information that may result in a personalized plan for cancer prevention and surveillance. Therefore, it is recommended he continue to follow the cancer management and screening guidelines provided by his primary healthcare provider.  RECOMMENDATIONS FOR FAMILY MEMBERS:   Since he did not inherit a mutation in a cancer predisposition gene included on this panel, his children could not have inherited a mutation from him in one of these genes. Other members of the family may still carry a pathogenic variant in one of these genes that Marvin Rivera did not inherit. Based on the family history, we recommend his father have genetic counseling and testing.   FOLLOW-UP:  Cancer genetics is a rapidly advancing field and it is possible that new genetic tests will be appropriate for him and/or his family members in the future. We encouraged him to remain in contact with cancer genetics on an annual basis so we can update his personal and family histories and let him know of advances in cancer genetics that may benefit this family.   Our contact number was provided. Marvin. Hartling questions were answered to his satisfaction, and he knows he is welcome to  call us at anytime with additional questions or concerns.   Lalla Brothers, MS, [facility] Genetic Counselor [email or web address] (P) [phone]

## 2022-12-24 ENCOUNTER — Ambulatory Visit (INDEPENDENT_AMBULATORY_CARE_PROVIDER_SITE_OTHER): Payer: BC Managed Care – PPO | Admitting: Gastroenterology

## 2022-12-24 ENCOUNTER — Other Ambulatory Visit (INDEPENDENT_AMBULATORY_CARE_PROVIDER_SITE_OTHER): Payer: Self-pay | Admitting: Gastroenterology

## 2022-12-24 ENCOUNTER — Encounter (INDEPENDENT_AMBULATORY_CARE_PROVIDER_SITE_OTHER): Payer: Self-pay | Admitting: Gastroenterology

## 2022-12-24 VITALS — BP 130/82 | HR 69 | Temp 97.8°F | Ht 70.0 in | Wt 358.8 lb

## 2022-12-24 DIAGNOSIS — K921 Melena: Secondary | ICD-10-CM

## 2022-12-24 DIAGNOSIS — K909 Intestinal malabsorption, unspecified: Secondary | ICD-10-CM | POA: Insufficient documentation

## 2022-12-24 DIAGNOSIS — R748 Abnormal levels of other serum enzymes: Secondary | ICD-10-CM | POA: Diagnosis not present

## 2022-12-24 DIAGNOSIS — R197 Diarrhea, unspecified: Secondary | ICD-10-CM

## 2022-12-24 DIAGNOSIS — K760 Fatty (change of) liver, not elsewhere classified: Secondary | ICD-10-CM

## 2022-12-24 DIAGNOSIS — Z6841 Body Mass Index (BMI) 40.0 and over, adult: Secondary | ICD-10-CM | POA: Insufficient documentation

## 2022-12-24 MED ORDER — PEG 3350-KCL-NA BICARB-NACL 420 G PO SOLR: 4000.0000 mL | Freq: Once | ORAL | 0 refills | Status: DC

## 2022-12-24 NOTE — Progress Notes (Signed)
Vista Lawman , M.D. Gastroenterology & Hepatology [facility] [address] [place], [place] [address] Primary Care Physician: Babs Sciara, MD [address] [place] [address]  Chief Complaint: Painless hematochezia, elevated liver enzymes, diarrhea  History of Present Illness: Marvin Rivera is a 32 y.o. male with class III obesity who presents for evaluation of Painless hematochezia, elevated liver enzymes, diarrhea  Patient reports that for most of his life he has been having diarrhea, which recently has been worse now with blood per rectum.  Patient reports that he would have 4-5 Bristol stool scale 6 bowel movements daily accompanied by generalized abdominal pain mostly located in left lower quadrant.  Patient for past couple weeks has been noticing fresh blood mixed with stool and also drips in the toilet bowl.  He has good appetite denies any dysphagia or postprandial pain.  The patient denies having any nausea, vomiting, fever, chills,  melena, hematemesis, abdominal distention, jaundice, pruritus or weight loss.  Recently had genetic counseling done and found no pathogenic mutations.  He has family history of Carney syndrome  Patient finished course of amoxicillin for pneumonia.  Last WGN:FAOZ Last Colonoscopy:None  FHx: neg for any gastrointestinal/liver disease, no malignancies Social: neg smoking, alcohol or illicit drug use Surgical: Cholecystectomy  Past Medical History: Past Medical History:  Diagnosis Date   Gallstones    Hypertension    Liver cyst 2009   Sleep apnea     Past Surgical History: Past Surgical History:  Procedure Laterality Date   CHOLECYSTECTOMY      Family History: Family History  Problem Relation Age of Onset   Lung cancer Mother 68   Atrial fibrillation Mother    Hypertension Mother    Atrial fibrillation Father    Sleep apnea Brother    Other Paternal Aunt         tumor (unspecified location)   Other Paternal Aunt        cardiac tumor (likely myxoma), carney complex   Other Paternal Uncle        cardiac tumor (likely myxoma)   Heart disease Paternal Grandmother        cardiac tumor (likely myxoma)   Other Paternal Grandmother        carney complex   Lung cancer Paternal Grandfather    Other Cousin        pituitary adenoma, carney complex, paternal first couin once removed    Social History: Social History   Tobacco Use  Smoking Status Never   Passive exposure: Current  Smokeless Tobacco Never   Social History   Substance and Sexual Activity  Alcohol Use Yes   Comment: rarely   Social History   Substance and Sexual Activity  Drug Use No    Allergies: Allergies  Allergen Reactions   Bee Venom     yellowjackets    Medications: No current outpatient medications on file.   No current facility-administered medications for this visit.    Review of Systems: GENERAL: negative for malaise, night sweats HEENT: No changes in hearing or vision, no nose bleeds or other nasal problems. NECK: Negative for lumps, goiter, pain and significant neck swelling RESPIRATORY: Negative for cough, wheezing CARDIOVASCULAR: Negative for chest pain, leg swelling, palpitations, orthopnea GI: SEE HPI MUSCULOSKELETAL: Negative for joint pain or swelling, back pain, and muscle pain. SKIN: Negative for lesions, rash HEMATOLOGY Negative for prolonged bleeding, bruising easily, and swollen nodes. ENDOCRINE: Negative for cold or heat  intolerance, polyuria, polydipsia and goiter. NEURO: negative for tremor, gait imbalance, syncope and seizures. The remainder of the review of systems is noncontributory.   Physical Exam: BP 130/82 (BP Location: Left Arm, Patient Position: Sitting, Cuff Size: Large)   Pulse 69   Temp 97.8 F (36.6 C) (Oral)   Ht 5\' 10"  (1.778 m)   Wt (!) 358 lb 12.8 oz (162.8 kg)   BMI 51.48 kg/m  GENERAL: The patient is AO x3, in  no acute distress. HEENT: Head is normocephalic and atraumatic. EOMI are intact. Mouth is well hydrated and without lesions. NECK: Supple. No masses LUNGS: Clear to auscultation. No presence of rhonchi/wheezing/rales. Adequate chest expansion HEART: RRR, normal s1 and s2. ABDOMEN: Soft, nontender, no guarding, no peritoneal signs, and nondistended. BS +. No masses. EXTREMITIES: Without any cyanosis, clubbing, rash, lesions or edema. NEUROLOGIC: AOx3, no focal motor deficit. SKIN: no jaundice, no rashes   Imaging/Labs: as above  I personally reviewed and interpreted the available labs, imaging and endoscopic files.  Hemoglobin 13.8, had some leukocytosis of 13.5 platelet 381  CT Abdomen pelvis   IMPRESSION: 1. No acute abdominopelvic process. 2. Hepatic steatosis.   Impression and Plan:  Marvin Rivera is a 32 y.o. male with class III obesity who presents for evaluation of Painless hematochezia, elevated liver enzymes, diarrhea  #Painless Hematochezia #Diarrhea  Patient with chronic diarrhea and now pain is hematochezia need to rule out inflammatory bowel disease via diagnostic colonoscopy and TI intubation  This could be hemorrhoidal bleed but patient has diarrhea and denies straining.  We will evaluate for celiac disease send inflammatory markers and CRP obtain a baseline fecal calprotectin.  Also patient recently had antibiotics exposure (amoxicillin) with worsening diarrhea will rule out C. 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  Will also obtain baseline ultrasound to be followed in future  #MASLD  #BMI:  51  Patient with elevated liver enzymes imaging finding of hepatic steatosis likely MASLD given risk factor of BMI 51 (class III obesity)    Recommendation :     - walking at a brisk pace/biking at moderate intesity 2.5-5 hours per week     - use pedometer/step counter to track activity     - goal to lose 5-10% of initial body weight     - avoid suagry drinks and juices, use zero calorie beverages     - increase water intake     - eat a low carb diet with plenty of veggies and fruit     - Get sufficient sleep 7-8 hrs nightly     - maitain active lifestyle     - avoid alcohol     - recommend 2-3 cups Coffee daily     - Counsel on lowering cholesterol by having a diet rich in vegetables,          protein (avoid red meats) and good fats(fish, salmon). - Low glycemic diet, aerobic exercise - Consider semaglutide as above. Other medications that may be used include orlistat, Qsymia, Contrave - Can also consider endoscopic bariatric surgery  All questions were answered.      Jannet Askew  Jonatha Gagen, MD Gastroenterology and Hepatology [facility] Gastroenterology   This chart has been completed using [facility] Dictation software, and while attempts have been made to ensure accuracy , certain words and phrases may not be transcribed as intended

## 2022-12-24 NOTE — Patient Instructions (Signed)
It was very nice to meet you today, as dicussed with will plan for the following :  1) Colonoscopy  2) Labwork and Ultrasound  3)  Weight loss:     - walking at a brisk pace/biking at moderate intesity 2.5-5 hours per week     - use pedometer/step counter to track activity     - goal to lose 5-10% of initial body weight     - avoid suagry drinks and juices, use zero calorie beverages     - increase water intake     - eat a low carb diet with plenty of veggies and fruit     - Get sufficient sleep 7-8 hrs nightly     - maitain active lifestyle     - avoid alcohol     - recommend 2-3 cups Coffee daily     - Counsel on lowering cholesterol by having a diet rich in vegetables,          protein (avoid red meats) and good fats(fish, salmon).

## 2022-12-24 NOTE — H&P (View-Only) (Signed)
Vista Lawman , M.D. Gastroenterology & Hepatology [facility] Gastroenterology [address] [place], [place] [address] Primary Care Physician: Babs Sciara, MD [address] [place] [address]  Chief Complaint: Painless hematochezia, elevated liver enzymes, diarrhea  History of Present Illness: Marvin Rivera is a 32 y.o. male with class III obesity who presents for evaluation of Painless hematochezia, elevated liver enzymes, diarrhea  Patient reports that for most of his life he has been having diarrhea, which recently has been worse now with blood per rectum.  Patient reports that he would have 4-5 Bristol stool scale 6 bowel movements daily accompanied by generalized abdominal pain mostly located in left lower quadrant.  Patient for past couple weeks has been noticing fresh blood mixed with stool and also drips in the toilet bowl.  He has good appetite denies any dysphagia or postprandial pain.  The patient denies having any nausea, vomiting, fever, chills,  melena, hematemesis, abdominal distention, jaundice, pruritus or weight loss.  Recently had genetic counseling done and found no pathogenic mutations.  He has family history of Carney syndrome  Patient finished course of amoxicillin for pneumonia.  Last MVH:QION Last Colonoscopy:None  FHx: neg for any gastrointestinal/liver disease, no malignancies Social: neg smoking, alcohol or illicit drug use Surgical: Cholecystectomy  Past Medical History: Past Medical History:  Diagnosis Date   Gallstones    Hypertension    Liver cyst 2009   Sleep apnea     Past Surgical History: Past Surgical History:  Procedure Laterality Date   CHOLECYSTECTOMY      Family History: Family History  Problem Relation Age of Onset   Lung cancer Mother 59   Atrial fibrillation Mother    Hypertension Mother    Atrial fibrillation Father    Sleep apnea Brother    Other Paternal Aunt         tumor (unspecified location)   Other Paternal Aunt        cardiac tumor (likely myxoma), carney complex   Other Paternal Uncle        cardiac tumor (likely myxoma)   Heart disease Paternal Grandmother        cardiac tumor (likely myxoma)   Other Paternal Grandmother        carney complex   Lung cancer Paternal Grandfather    Other Cousin        pituitary adenoma, carney complex, paternal first couin once removed    Social History: Social History   Tobacco Use  Smoking Status Never   Passive exposure: Current  Smokeless Tobacco Never   Social History   Substance and Sexual Activity  Alcohol Use Yes   Comment: rarely   Social History   Substance and Sexual Activity  Drug Use No    Allergies: Allergies  Allergen Reactions   Bee Venom     yellowjackets    Medications: No current outpatient medications on file.   No current facility-administered medications for this visit.    Review of Systems: GENERAL: negative for malaise, night sweats HEENT: No changes in hearing or vision, no nose bleeds or other nasal problems. NECK: Negative for lumps, goiter, pain and significant neck swelling RESPIRATORY: Negative for cough, wheezing CARDIOVASCULAR: Negative for chest pain, leg swelling, palpitations, orthopnea GI: SEE HPI MUSCULOSKELETAL: Negative for joint pain or swelling, back pain, and muscle pain. SKIN: Negative for lesions, rash HEMATOLOGY Negative for prolonged bleeding, bruising easily, and swollen nodes. ENDOCRINE: Negative for cold or heat  intolerance, polyuria, polydipsia and goiter. NEURO: negative for tremor, gait imbalance, syncope and seizures. The remainder of the review of systems is noncontributory.   Physical Exam: BP 130/82 (BP Location: Left Arm, Patient Position: Sitting, Cuff Size: Large)   Pulse 69   Temp 97.8 F (36.6 C) (Oral)   Ht 5\' 10"  (1.778 m)   Wt (!) 358 lb 12.8 oz (162.8 kg)   BMI 51.48 kg/m  GENERAL: The patient is AO x3, in  no acute distress. HEENT: Head is normocephalic and atraumatic. EOMI are intact. Mouth is well hydrated and without lesions. NECK: Supple. No masses LUNGS: Clear to auscultation. No presence of rhonchi/wheezing/rales. Adequate chest expansion HEART: RRR, normal s1 and s2. ABDOMEN: Soft, nontender, no guarding, no peritoneal signs, and nondistended. BS +. No masses. EXTREMITIES: Without any cyanosis, clubbing, rash, lesions or edema. NEUROLOGIC: AOx3, no focal motor deficit. SKIN: no jaundice, no rashes   Imaging/Labs: as above  I personally reviewed and interpreted the available labs, imaging and endoscopic files.  Hemoglobin 13.8, had some leukocytosis of 13.5 platelet 381  CT Abdomen pelvis   IMPRESSION: 1. No acute abdominopelvic process. 2. Hepatic steatosis.   Impression and Plan:  Marvin Rivera is a 32 y.o. male with class III obesity who presents for evaluation of Painless hematochezia, elevated liver enzymes, diarrhea  #Painless Hematochezia #Diarrhea  Patient with chronic diarrhea and now pain is hematochezia need to rule out inflammatory bowel disease via diagnostic colonoscopy and TI intubation  This could be hemorrhoidal bleed but patient has diarrhea and denies straining.  We will evaluate for celiac disease send inflammatory markers and CRP obtain a baseline fecal calprotectin.  Also patient recently had antibiotics exposure (amoxicillin) with worsening diarrhea will rule out C. 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  Will also obtain baseline ultrasound to be followed in future  #MASLD  #BMI:  51  Patient with elevated liver enzymes imaging finding of hepatic steatosis likely MASLD given risk factor of BMI 51 (class III obesity)    Recommendation :     - walking at a brisk pace/biking at moderate intesity 2.5-5 hours per week     - use pedometer/step counter to track activity     - goal to lose 5-10% of initial body weight     - avoid suagry drinks and juices, use zero calorie beverages     - increase water intake     - eat a low carb diet with plenty of veggies and fruit     - Get sufficient sleep 7-8 hrs nightly     - maitain active lifestyle     - avoid alcohol     - recommend 2-3 cups Coffee daily     - Counsel on lowering cholesterol by having a diet rich in vegetables,          protein (avoid red meats) and good fats(fish, salmon). - Low glycemic diet, aerobic exercise - Consider semaglutide as above. Other medications that may be used include orlistat, Qsymia, Contrave - Can also consider endoscopic bariatric surgery  All questions were answered.      Jannet Askew  Jamaiya Tunnell, MD Gastroenterology and Hepatology [facility] Gastroenterology   This chart has been completed using [facility] Dictation software, and while attempts have been made to ensure accuracy , certain words and phrases may not be transcribed as intended

## 2022-12-27 LAB — CALPROTECTIN, FECAL: Calprotectin, Fecal: 6 ug/g (ref 0–120)

## 2022-12-28 LAB — C DIFFICILE TOXINS A+B W/RFLX: C difficile Toxins A+B, EIA: NEGATIVE

## 2022-12-28 LAB — C DIFFICILE, CYTOTOXIN B

## 2022-12-30 LAB — PROTIME-INR
INR: 1 (ref 0.9–1.2)
Prothrombin Time: 10.8 s (ref 9.1–12.0)

## 2022-12-30 LAB — IRON,TIBC AND FERRITIN PANEL
Ferritin: 381 ng/mL (ref 30–400)
Iron Saturation: 25 % (ref 15–55)
Iron: 76 ug/dL (ref 38–169)
Total Iron Binding Capacity: 304 ug/dL (ref 250–450)
UIBC: 228 ug/dL (ref 111–343)

## 2022-12-30 LAB — C-REACTIVE PROTEIN: CRP: 5 mg/L (ref 0–10)

## 2022-12-30 LAB — CELIAC AB TTG DGP TIGA
Antigliadin Abs, IgA: 3 U (ref 0–19)
Gliadin IgG: 2 U (ref 0–19)
IgA/Immunoglobulin A, Serum: 139 mg/dL (ref 90–386)
Tissue Transglut Ab: <2 U/mL (ref 0–5)
Transglutaminase IgA: <2 U/mL (ref 0–3)

## 2022-12-30 LAB — HIV ANTIBODY (ROUTINE TESTING W REFLEX): HIV Screen 4th Generation wRfx: NONREACTIVE

## 2022-12-30 LAB — HEPATITIS A ANTIBODY, TOTAL: hep A Total Ab: NEGATIVE

## 2022-12-30 LAB — ANTI-SMOOTH MUSCLE ANTIBODY, IGG: Smooth Muscle Ab: 19 U (ref 0–19)

## 2022-12-30 LAB — HEPATITIS C ANTIBODY: Hep C Virus Ab: NONREACTIVE

## 2022-12-30 LAB — ANA: ANA Titer 1: NEGATIVE

## 2022-12-30 LAB — HEPATITIS B CORE ANTIBODY, TOTAL: Hep B Core Total Ab: NEGATIVE

## 2022-12-30 LAB — HEPATITIS B SURFACE ANTIGEN: Hepatitis B Surface Ag: NEGATIVE

## 2022-12-30 LAB — HEPATITIS B SURFACE ANTIBODY,QUALITATIVE: Hep B Surface Ab, Qual: NONREACTIVE

## 2022-12-30 NOTE — Progress Notes (Signed)
Hi Toniann Fail ,  Can you please call the patient and tell the patient the lab work shows negative hepatitis A, B and C.  Blood work negative for autoimmune hepatitis.  This is all good news  Stool studies negative for C. difficile infection and inflammation   At this time recommend to continue working on weight loss and to proceed with colonoscopy as discussed earlier given reports of blood per rectum  thanks,  Vista Lawman, MD Gastroenterology and Hepatology [facility] Gastroenterology  Workup:  Normal CRP, fecal calprotectin, negative celiac serologies Negative C. Difficile Negative ANA, ASMA Normal INR Normal iron level (ferritin 381) Hepatitis A nonimmune hepatitis B nonexposed nonimmune hepatitis C negative HIV negative  Future if patient continues to have elevated liver enzymes would be a good candidate for Rezdiffra (resmetirom)

## 2023-01-01 ENCOUNTER — Telehealth (INDEPENDENT_AMBULATORY_CARE_PROVIDER_SITE_OTHER): Payer: Self-pay | Admitting: *Deleted

## 2023-01-01 ENCOUNTER — Other Ambulatory Visit (INDEPENDENT_AMBULATORY_CARE_PROVIDER_SITE_OTHER): Payer: Self-pay

## 2023-01-01 MED ORDER — PEG 3350-KCL-NA BICARB-NACL 420 G PO SOLR: 4000.0000 mL | Freq: Once | ORAL | 0 refills | Status: AC

## 2023-01-01 NOTE — Telephone Encounter (Signed)
Pt wanted prep for colonoscopy sent to [facility] [place]

## 2023-01-01 NOTE — Telephone Encounter (Signed)
Prep sent to pharmacy. Per wife, no need to call back

## 2023-01-05 ENCOUNTER — Ambulatory Visit (HOSPITAL_COMMUNITY)
Admission: RE | Admit: 2023-01-05 | Discharge: 2023-01-05 | Disposition: A | Discharge status: Home / Self Care | Payer: BC Managed Care – PPO | Source: Ambulatory Visit | Attending: Gastroenterology | Admitting: Gastroenterology

## 2023-01-05 DIAGNOSIS — R748 Abnormal levels of other serum enzymes: Secondary | ICD-10-CM | POA: Diagnosis present

## 2023-01-05 DIAGNOSIS — K760 Fatty (change of) liver, not elsewhere classified: Secondary | ICD-10-CM | POA: Diagnosis present

## 2023-01-05 NOTE — Progress Notes (Signed)
Hi Marvin Rivera ,  Can you please call the patient and tell the patient the ultrasound shows fatty liver disease  Please have the patient to continue to see [place] in the clinic  Thanks,  Vista Lawman, MD Gastroenterology and Hepatology [facility] Gastroenterology

## 2023-01-07 NOTE — Patient Instructions (Incomplete)
Please continue using your CPAP regularly. While your insurance requires that you use CPAP at least 4 hours each night on 70% of the nights, I recommend, that you not skip any nights and use it throughout the night if you can. Getting used to CPAP and staying with the treatment long term does take time and patience and discipline. Untreated obstructive sleep apnea when it is moderate to severe can have an adverse impact on cardiovascular health and raise her risk for heart disease, arrhythmias, hypertension, congestive heart failure, stroke and diabetes. Untreated obstructive sleep apnea causes sleep disruption, nonrestorative sleep, and sleep deprivation. This can have an impact on your day to day functioning and cause daytime sleepiness and impairment of cognitive function, memory loss, mood disturbance, and problems focussing. Using CPAP regularly can improve these symptoms.  We will update supply orders, today. Let me know if you do not hear back from [facility]. Keep a close eye your BP.   Follow up in 1 year

## 2023-01-07 NOTE — Progress Notes (Unsigned)
PATIENT: Marvin Rivera DOB: [birth year]  REASON FOR VISIT: follow up HISTORY FROM: patient  No chief complaint on file.    HISTORY OF PRESENT ILLNESS:  01/07/23 ALL:  Marvin Rivera is a 32 y.o. male here today for follow up for OSA on CPAP. He was last seen by Dr Frances Furbish for follow up 06/2021 and doing well. Since,     HISTORY: (copied from Dr Teofilo Pod previous note)  Mr. Marvin Rivera is a 32 year old right-handed gentleman with an underlying medical history of cholecystitis, chest pain, hypertension, liver cyst, and severe obesity with a BMI of over 45, who Presents for follow-up consultation of his obstructive sleep apnea after interim home sleep testing and starting AutoPap therapy.  The patient is unaccompanied today.  I first met him at the request of his primary care physician on 12/12/2020, at which time he reported snoring, witnessed apneas as well as excessive daytime somnolence.  He was advised to proceed with a sleep study.  He had a home sleep test on 02/04/2021 which indicated moderate obstructive sleep apnea with a total AHI of 15.2/hour and O2 nadir of 85%.  Snoring was detected in the mild-to-moderate range, at times louder.  He was advised to start AutoPap therapy.  His set up date was 04/05/2021.  He has a ResMed air sense 10 machine.   Today, 07/03/2021: I reviewed his AutoPap compliance data from 06/02/2021 through 07/01/2021, which is a total of 30 days, during which time he used his machine every night with percent use days greater than 4 hours at 87%, indicating very good compliance with an average usage of 6 hours and 19 minutes, residual AHI at goal at 0.6/h, average pressure for the 95th percentile at 11.1 cm with a range of 6 to 18 cm, leak on the higher side with the 95th percentile at 29.1 L/min.  He reports feeling better.  He has better daytime energy and less tiredness.  He has had some weight gain, probably in part due to stress, he believes. His wife is noted that he no  longer snores but he has had some leak from the mask.  He uses a fullface mask, of note, he also has a full beard.   Of note, his DME company is [employer].  I had requested his AutoPap range to be 6 to 12 cm, not sure why they placed him on maximum pressure of 18 cm, maximum pressure for this past month was 12.3 cm.  He reports that sleep is sometimes interrupted because his 17-year-old does not sleep well.  He has a 34 year old and a 34-year-old as well.  His first machine was defective and he got a new machine in January 2023.  He has plenty of supplies and knows to change the filter monthly.   He had interim emergency room visits in November 2022 for acute gout and dental abscess, and an emergency room visit in January 2023 for abdominal pain.  He was found to have epiploic appendagitis.    REVIEW OF SYSTEMS: Out of a complete 14 system review of symptoms, the patient complains only of the following symptoms, and all other reviewed systems are negative.  ESS:  ALLERGIES: Allergies  Allergen Reactions   Bee Venom     yellowjackets    HOME MEDICATIONS: No outpatient medications prior to visit.   No facility-administered medications prior to visit.    PAST MEDICAL HISTORY: Past Medical History:  Diagnosis Date   Gallstones    Hypertension  Liver cyst 2009   Sleep apnea     PAST SURGICAL HISTORY: Past Surgical History:  Procedure Laterality Date   CHOLECYSTECTOMY      FAMILY HISTORY: Family History  Problem Relation Age of Onset   Lung cancer Mother 4   Atrial fibrillation Mother    Hypertension Mother    Atrial fibrillation Father    Sleep apnea Brother    Other Paternal Aunt        tumor (unspecified location)   Other Paternal Aunt        cardiac tumor (likely myxoma), carney complex   Other Paternal Uncle        cardiac tumor (likely myxoma)   Heart disease Paternal Grandmother        cardiac tumor (likely myxoma)   Other Paternal Grandmother        carney  complex   Lung cancer Paternal Grandfather    Other Cousin        pituitary adenoma, carney complex, paternal first couin once removed    SOCIAL HISTORY: Social History   Socioeconomic History   Marital status: Married    Spouse name: Not on file   Number of children: 3   Years of education: Not on file   Highest education level: Not on file  Occupational History   Not on file  Tobacco Use   Smoking status: Never    Passive exposure: Current   Smokeless tobacco: Never  Vaping Use   Vaping status: Never Used  Substance and Sexual Activity   Alcohol use: Yes    Comment: rarely   Drug use: No   Sexual activity: Yes  Other Topics Concern   Not on file  Social History Narrative   Not on file   Social Determinants of Health   Financial Resource Strain: Not on file  Food Insecurity: Not on file  Transportation Needs: Not on file  Physical Activity: Not on file  Stress: Not on file  Social Connections: Not on file  Intimate Partner Violence: Not on file     PHYSICAL EXAM  There were no vitals filed for this visit. There is no height or weight on file to calculate BMI.  Generalized: Well developed, in no acute distress  Cardiology: normal rate and rhythm, no murmur noted Respiratory: clear to auscultation bilaterally  Neurological examination  Mentation: Alert oriented to time, place, history taking. Follows all commands speech and language fluent Cranial nerve II-XII: Pupils were equal round reactive to light. Extraocular movements were full, visual field were full  Motor: The motor testing reveals 5 over 5 strength of all 4 extremities. Good symmetric motor tone is noted throughout.  Gait and station: Gait is normal.    DIAGNOSTIC DATA (LABS, IMAGING, TESTING) - I reviewed patient records, labs, notes, testing and imaging myself where available.      No data to display           Lab Results  Component Value Date   WBC 13.5 (H) 12/13/2022   HGB 13.8  12/13/2022   HCT 40.6 12/13/2022   MCV 88.5 12/13/2022   PLT 381 12/13/2022      Component Value Date/Time   NA 135 12/13/2022 0045   NA 138 10/13/2022 0931   K 3.6 12/13/2022 0045   CL 102 12/13/2022 0045   CO2 21 (L) 12/13/2022 0045   GLUCOSE 107 (H) 12/13/2022 0045   BUN 16 12/13/2022 0045   BUN 13 10/13/2022 0931   CREATININE 0.96 12/13/2022  0045   CALCIUM 9.1 12/13/2022 0045   PROT 7.0 12/13/2022 0045   PROT 6.7 10/13/2022 0931   ALBUMIN 4.2 12/13/2022 0045   ALBUMIN 4.4 10/13/2022 0931   AST 26 12/13/2022 0045   ALT 45 (H) 12/13/2022 0045   ALKPHOS 59 12/13/2022 0045   BILITOT 1.0 12/13/2022 0045   BILITOT 1.2 10/13/2022 0931   GFRNONAA >60 12/13/2022 0045   GFRAA 113 10/01/2017 1537   Lab Results  Component Value Date   CHOL 199 10/13/2022   HDL 36 (L) 10/13/2022   LDLCALC 135 (H) 10/13/2022   TRIG 157 (H) 10/13/2022   CHOLHDL 5.5 (H) 10/13/2022   Lab Results  Component Value Date   HGBA1C 5.9 (H) 10/13/2022   No results found for: "VITAMINB12" Lab Results  Component Value Date   TSH 2.150 10/13/2022     ASSESSMENT AND PLAN 32 y.o. year old male  has a past medical history of Gallstones, Hypertension, Liver cyst (2009), and Sleep apnea. here with   No diagnosis found.    ASHOK PLESHA is doing well on CPAP therapy. Compliance report reveals ***. *** was encouraged to continue using CPAP nightly and for greater than 4 hours each night. We will update supply orders as indicated. Risks of untreated sleep apnea review and education materials provided. Healthy lifestyle habits encouraged. *** will follow up in ***, sooner if needed. *** verbalizes understanding and agreement with this plan.    No orders of the defined types were placed in this encounter.    No orders of the defined types were placed in this encounter.     Shawnie Dapper, FNP-C 01/07/2023, 1:33 PM [facility] [address] [place], [place] [address] [phone]

## 2023-01-08 ENCOUNTER — Encounter: Payer: Self-pay | Admitting: Family Medicine

## 2023-01-08 ENCOUNTER — Ambulatory Visit (INDEPENDENT_AMBULATORY_CARE_PROVIDER_SITE_OTHER): Payer: BC Managed Care – PPO | Admitting: Family Medicine

## 2023-01-08 VITALS — BP 149/85 | HR 79 | Ht 72.0 in | Wt 363.5 lb

## 2023-01-08 DIAGNOSIS — G4733 Obstructive sleep apnea (adult) (pediatric): Secondary | ICD-10-CM

## 2023-01-21 ENCOUNTER — Encounter (HOSPITAL_COMMUNITY): Payer: Self-pay

## 2023-01-21 ENCOUNTER — Other Ambulatory Visit: Payer: Self-pay

## 2023-01-21 ENCOUNTER — Ambulatory Visit (HOSPITAL_COMMUNITY): Payer: BC Managed Care – PPO | Admitting: Anesthesiology

## 2023-01-21 ENCOUNTER — Encounter (HOSPITAL_COMMUNITY)
Admission: RE | Disposition: A | Discharge status: Home / Self Care | Payer: Self-pay | Source: Home / Self Care | Attending: Gastroenterology

## 2023-01-21 ENCOUNTER — Ambulatory Visit (HOSPITAL_COMMUNITY)
Admission: RE | Admit: 2023-01-21 | Discharge: 2023-01-21 | Disposition: A | Discharge status: Home / Self Care | Payer: BC Managed Care – PPO | Attending: Gastroenterology | Admitting: Gastroenterology

## 2023-01-21 DIAGNOSIS — K6389 Other specified diseases of intestine: Secondary | ICD-10-CM

## 2023-01-21 DIAGNOSIS — D125 Benign neoplasm of sigmoid colon: Secondary | ICD-10-CM

## 2023-01-21 DIAGNOSIS — I10 Essential (primary) hypertension: Secondary | ICD-10-CM | POA: Insufficient documentation

## 2023-01-21 DIAGNOSIS — K649 Unspecified hemorrhoids: Secondary | ICD-10-CM | POA: Diagnosis not present

## 2023-01-21 DIAGNOSIS — G4730 Sleep apnea, unspecified: Secondary | ICD-10-CM | POA: Insufficient documentation

## 2023-01-21 DIAGNOSIS — K529 Noninfective gastroenteritis and colitis, unspecified: Secondary | ICD-10-CM | POA: Insufficient documentation

## 2023-01-21 DIAGNOSIS — K921 Melena: Secondary | ICD-10-CM | POA: Diagnosis not present

## 2023-01-21 DIAGNOSIS — K760 Fatty (change of) liver, not elsewhere classified: Secondary | ICD-10-CM | POA: Diagnosis not present

## 2023-01-21 DIAGNOSIS — Z6841 Body Mass Index (BMI) 40.0 and over, adult: Secondary | ICD-10-CM | POA: Insufficient documentation

## 2023-01-21 DIAGNOSIS — K635 Polyp of colon: Secondary | ICD-10-CM | POA: Diagnosis not present

## 2023-01-21 HISTORY — PX: BIOPSY: SHX5522

## 2023-01-21 HISTORY — PX: COLONOSCOPY WITH PROPOFOL: SHX5780

## 2023-01-21 HISTORY — PX: POLYPECTOMY: SHX5525

## 2023-01-21 LAB — HM COLONOSCOPY

## 2023-01-21 SURGERY — COLONOSCOPY WITH PROPOFOL
Anesthesia: General

## 2023-01-21 MED ORDER — PROPOFOL 500 MG/50ML IV EMUL
INTRAVENOUS | Status: AC
  Filled 2023-01-21: qty 50

## 2023-01-21 MED ORDER — LIDOCAINE HCL (CARDIAC) PF 100 MG/5ML IV SOSY
PREFILLED_SYRINGE | INTRAVENOUS | Status: DC | PRN
  Administered 2023-01-21: 80 mg via INTRAVENOUS

## 2023-01-21 MED ORDER — LACTATED RINGERS IV SOLN
INTRAVENOUS | Status: DC
  Administered 2023-01-21: 1000 mL via INTRAVENOUS

## 2023-01-21 MED ORDER — PROPOFOL 10 MG/ML IV BOLUS
INTRAVENOUS | Status: DC | PRN
  Administered 2023-01-21: 100 mg via INTRAVENOUS

## 2023-01-21 MED ORDER — PROPOFOL 500 MG/50ML IV EMUL
INTRAVENOUS | Status: DC | PRN
  Administered 2023-01-21: 150 ug/kg/min via INTRAVENOUS

## 2023-01-21 NOTE — Op Note (Signed)
[facility] Patient Name: Marvin Rivera Procedure Date: 01/21/2023 10:25 AM MRN: [id number] Date of Birth: 1991-03-01 Attending MD: Sanjuan Dame , MD, [id number] CSN: [id number] Age: 32 Admit Type: Outpatient Procedure:                Colonoscopy Indications:              Hematochezia Providers:                Sanjuan Dame, MD, Edrick Kins, RN, Elinor Parkinson, Volanda Napoleon., Technician Referring MD:              Medicines:                Monitored Anesthesia Care Complications:            No immediate complications. Estimated Blood Loss:     Estimated blood loss: none. Procedure:                Pre-Anesthesia Assessment:                           - Prior to the procedure, a History and Physical                            was performed, and patient medications and                            allergies were reviewed. The patient's tolerance of                            previous anesthesia was also reviewed. The risks                            and benefits of the procedure and the sedation                            options and risks were discussed with the patient.                            All questions were answered, and informed consent                            was obtained. Prior Anticoagulants: The patient has                            taken no anticoagulant or antiplatelet agents. ASA                            Grade Assessment: II - A patient with mild systemic                            disease. After reviewing the risks and benefits,  the patient was deemed in satisfactory condition to                            undergo the procedure.                           After obtaining informed consent, the colonoscope                            was passed under direct vision. Throughout the                            procedure, the patient's blood pressure, pulse, and                            oxygen  saturations were monitored continuously. The                            [id number]-HQ190L ([id number]) scope was introduced through                            the anus and advanced to the the terminal ileum.                            The colonoscopy was performed without difficulty.                            The patient tolerated the procedure well. The                            quality of the bowel preparation was evaluated                            using the BBPS [facility] Bowel Preparation Scale)                            with scores of: Right Colon = 3, Transverse Colon =                            3 and Left Colon = 3 (entire mucosa seen well with                            no residual staining, small fragments of stool or                            opaque liquid). The total BBPS score equals 9. The                            terminal ileum, ileocecal valve, appendiceal                            orifice, and rectum were photographed. Scope In: 10:38:23 AM Scope Out: 10:58:03 AM Scope Withdrawal Time: 0 hours 18  minutes 0 seconds  Total Procedure Duration: 0 hours 19 minutes 40 seconds  Findings:      Hemorrhoids were found on perianal exam.      A 6 mm polyp was found in the sigmoid colon. The polyp was sessile. The       polyp was removed with a cold snare. Resection and retrieval were       complete.      There is no endoscopic evidence of bleeding or ulcerations in the entire       colon. Biopsies for histology were taken with a cold forceps from the       right colon and left colon for evaluation of microscopic colitis.      An area of the terminal ileum was congested. Biopsies were taken with a       cold forceps for histology. Impression:               - Hemorrhoids found on perianal exam.                           - One 6 mm polyp in the sigmoid colon, removed with                            a cold snare. Resected and retrieved.                           - Congested mucosa in the  terminal ileum. Biopsied. Moderate Sedation:      Per Anesthesia Care Recommendation:           - Patient has a contact number available for                            emergencies. The signs and symptoms of potential                            delayed complications were discussed with the                            patient. Return to normal activities tomorrow.                            Written discharge instructions were provided to the                            patient.                           - Resume previous diet.                           - Continue present medications.                           - Await pathology results.                           - Repeat colonoscopy at age 19 for screening  purposes. Procedure Code(s):        --- Professional ---                           [phone], Colonoscopy, flexible; with removal of                            tumor(s), polyp(s), or other lesion(s) by snare                            technique                           45380, 59, Colonoscopy, flexible; with biopsy,                            single or multiple Diagnosis Code(s):        --- Professional ---                           D12.5, Benign neoplasm of sigmoid colon                           K63.89, Other specified diseases of intestine                           K64.9, Unspecified hemorrhoids                           K92.1, Melena (includes Hematochezia) CPT copyright 2022 American Medical Association. All rights reserved. The codes documented in this report are preliminary and upon coder review may  be revised to meet current compliance requirements. Sanjuan Dame, MD Sanjuan Dame, MD 01/21/2023 11:02:35 AM This report has been signed electronically. Number of Addenda: 0

## 2023-01-21 NOTE — Interval H&P Note (Signed)
History and Physical Interval Note:  01/21/2023 9:33 AM  Marvin Rivera  has presented today for surgery, with the diagnosis of hematochezia.  The various methods of treatment have been discussed with the patient and family. After consideration of risks, benefits and other options for treatment, the patient has consented to  Procedure(s) with comments: COLONOSCOPY WITH PROPOFOL (N/A) - 10:30am;asa 2 as a surgical intervention.  The patient's history has been reviewed, patient examined, no change in status, stable for surgery.  I have reviewed the patient's chart and labs.  Questions were answered to the patient's satisfaction.     Juanetta Beets Zadok Holaway

## 2023-01-21 NOTE — Anesthesia Preprocedure Evaluation (Signed)
Anesthesia Evaluation  Patient identified by MRN, date of birth, ID band Patient awake    Reviewed: Allergy & Precautions, H&P , NPO status , Patient's Chart, lab work & pertinent test results, reviewed documented beta blocker date and time   Airway Mallampati: II  TM Distance: >3 FB Neck ROM: full    Dental no notable dental hx.    Pulmonary neg pulmonary ROS, sleep apnea    Pulmonary exam normal breath sounds clear to auscultation       Cardiovascular Exercise Tolerance: Good hypertension, negative cardio ROS  Rhythm:regular Rate:Normal     Neuro/Psych negative neurological ROS  negative psych ROS   GI/Hepatic negative GI ROS, Neg liver ROS,GERD  ,,  Endo/Other  negative endocrine ROS    Renal/GU negative Renal ROS  negative genitourinary   Musculoskeletal   Abdominal   Peds  Hematology negative hematology ROS (+)   Anesthesia Other Findings   Reproductive/Obstetrics negative OB ROS                             Anesthesia Physical Anesthesia Plan  ASA: 2  Anesthesia Plan: General   Post-op Pain Management:    Induction:   PONV Risk Score and Plan: Propofol infusion  Airway Management Planned:   Additional Equipment:   Intra-op Plan:   Post-operative Plan:   Informed Consent: I have reviewed the patients History and Physical, chart, labs and discussed the procedure including the risks, benefits and alternatives for the proposed anesthesia with the patient or authorized representative who has indicated his/her understanding and acceptance.     Dental Advisory Given  Plan Discussed with: CRNA  Anesthesia Plan Comments:        Anesthesia Quick Evaluation

## 2023-01-21 NOTE — Transfer of Care (Signed)
Immediate Anesthesia Transfer of Care Note  Patient: Marvin Rivera  Procedure(s) Performed: COLONOSCOPY WITH PROPOFOL BIOPSY POLYPECTOMY  Patient Location: Short Stay  Anesthesia Type:General  Level of Consciousness: awake and patient cooperative  Airway & Oxygen Therapy: Patient Spontanous Breathing and Patient connected to face mask oxygen  Post-op Assessment: Report given to RN and Post -op Vital signs reviewed and stable  Post vital signs: Reviewed and stable  Last Vitals:  Vitals Value Taken Time  BP 117/69 01/21/23 1103  Temp 36.6 C 01/21/23 1103  Pulse 71 01/21/23 1103  Resp 18 01/21/23 1103  SpO2 95 % 01/21/23 1103    Last Pain:  Vitals:   01/21/23 1103  TempSrc: Oral  PainSc: 0-No pain      Patients Stated Pain Goal: 10 (01/21/23 0933)  Complications: No notable events documented.

## 2023-01-21 NOTE — Discharge Instructions (Signed)

## 2023-01-22 ENCOUNTER — Encounter (INDEPENDENT_AMBULATORY_CARE_PROVIDER_SITE_OTHER): Payer: Self-pay | Admitting: *Deleted

## 2023-01-22 LAB — SURGICAL PATHOLOGY

## 2023-01-26 NOTE — Progress Notes (Signed)
I reviewed the pathology results. Ann, can you send her a letter with the findings as described below please? Repeat colonoscopy in 10 years  Thanks,  Vista Lawman, MD Gastroenterology and Hepatology [facility]  ---------------------------------------------------------------------------------------------  [facility] [address], [place], [place] [address] Phone:  [phone]   01/26/23 [place], [place]   Dear Marvin Rivera,  I am writing to inform you that the biopsies taken during your recent endoscopic examination showed:  Hyperplastic Polyp I am writing to let you know the results of your recent colonoscopy.  You had a total of 1 polyps removed. The pathology came back as "hyperplastic polyp." These findings are NOT cancer and do not turn into cancer. Given these findings, it is recommended that your next colonoscopy be performed in 10 years.  Also all the biopsies were normal  Please call us at [phone] if you have persistent problems or have questions about your condition that have not been fully answered at this time.  Sincerely,  Vista Lawman, MD Gastroenterology and Hepatology

## 2023-01-26 NOTE — Anesthesia Postprocedure Evaluation (Signed)
Anesthesia Post Note  Patient: Marvin Rivera  Procedure(s) Performed: COLONOSCOPY WITH PROPOFOL BIOPSY POLYPECTOMY  Patient location during evaluation: Phase II Anesthesia Type: General Level of consciousness: awake Pain management: pain level controlled Vital Signs Assessment: post-procedure vital signs reviewed and stable Respiratory status: spontaneous breathing and respiratory function stable Cardiovascular status: blood pressure returned to baseline and stable Postop Assessment: no headache and no apparent nausea or vomiting Anesthetic complications: no Comments: Late entry   No notable events documented.   Last Vitals:  Vitals:   01/21/23 0933 01/21/23 1103  BP: 138/63 117/69  Pulse: 68 71  Resp: 17 18  Temp: 36.6 C 36.6 C  SpO2: 97% 95%    Last Pain:  Vitals:   01/21/23 1103  TempSrc: Oral  PainSc: 0-No pain                 Windell Norfolk

## 2023-01-27 ENCOUNTER — Encounter (INDEPENDENT_AMBULATORY_CARE_PROVIDER_SITE_OTHER): Payer: Self-pay | Admitting: *Deleted

## 2023-01-29 ENCOUNTER — Encounter (HOSPITAL_COMMUNITY): Payer: Self-pay | Admitting: Gastroenterology

## 2023-03-18 ENCOUNTER — Ambulatory Visit: Payer: BC Managed Care – PPO | Admitting: Family Medicine

## 2023-03-18 VITALS — BP 138/84 | HR 74 | Temp 98.2°F | Ht 72.0 in | Wt 369.8 lb

## 2023-03-18 DIAGNOSIS — R739 Hyperglycemia, unspecified: Secondary | ICD-10-CM

## 2023-03-18 DIAGNOSIS — E785 Hyperlipidemia, unspecified: Secondary | ICD-10-CM

## 2023-03-18 DIAGNOSIS — K760 Fatty (change of) liver, not elsewhere classified: Secondary | ICD-10-CM

## 2023-03-18 DIAGNOSIS — R7303 Prediabetes: Secondary | ICD-10-CM

## 2023-03-19 NOTE — Progress Notes (Signed)
   Subjective:    Patient ID: Marvin Rivera, male    DOB: [birth year], 32 y.o.   MRN: [id number]  HPI Hyperglycemia  Fatty liver - Plan: Hepatic Function Panel  Morbid obesity (HCC)  Hyperlipidemia, unspecified hyperlipidemia type - Plan: Lipid Panel  Prediabetes - Plan: Hemoglobin A1c, Basic Metabolic Panel  The patient's BMI is calculated.  The patient does have obesity.  The patient does try to some degree staying active and watching diet.  It is in the vital signs and acknowledged.  It is above the recommended BMI for the patient's height and weight.  The patient has been counseled regarding healthy diet, restricted portions, avoiding excessive carbohydrates/sugary foods, and increase physical activity as health permits.  It is in the patient's best interest to lower the risk of secondary illness including heart disease strokes and cancer by losing weight.  The patient acknowledges this information.  Patient has hyperlipidemia we have counseled regarding healthy diet  Patient has history of prediabetes very important to get regular physical activity and exercise and to do routine to help minimize risk of progressing into future issues with this  GLP-1's would be a good choice but not currently covered by his insurance   Review of Systems     Objective:   Physical Exam  General-in no acute distress Eyes-no discharge Lungs-respiratory rate normal, CTA CV-no murmurs,RRR Extremities skin warm dry no edema Neuro grossly normal Behavior normal, alert       Assessment & Plan:   1. Hyperglycemia Healthy diet regular physical activity  2. Fatty liver Healthy diet regular physical activity weight reduction check labs - Hepatic Function Panel  3. Morbid obesity (HCC) Portion control regular physical activity GLP-1 would be a good choice but not covered currently  4. Hyperlipidemia, unspecified hyperlipidemia type Check lab work healthy diet recommended - Lipid  Panel  5. Prediabetes Healthy diet regular physical activity check lab work - Hemoglobin A1c - Basic Metabolic Panel Yearly wellness recommended

## 2023-03-20 IMAGING — DX DG CHEST 1V PORT
1 series · 1 of 1 positions shown · non-contrast
Comparison: 05/15/2020

CLINICAL DATA: Midsternal chest pain with began at 8 p.m. last
night, worse with lying down or bending over

EXAM:
PORTABLE CHEST 1 VIEW

[chest ap]
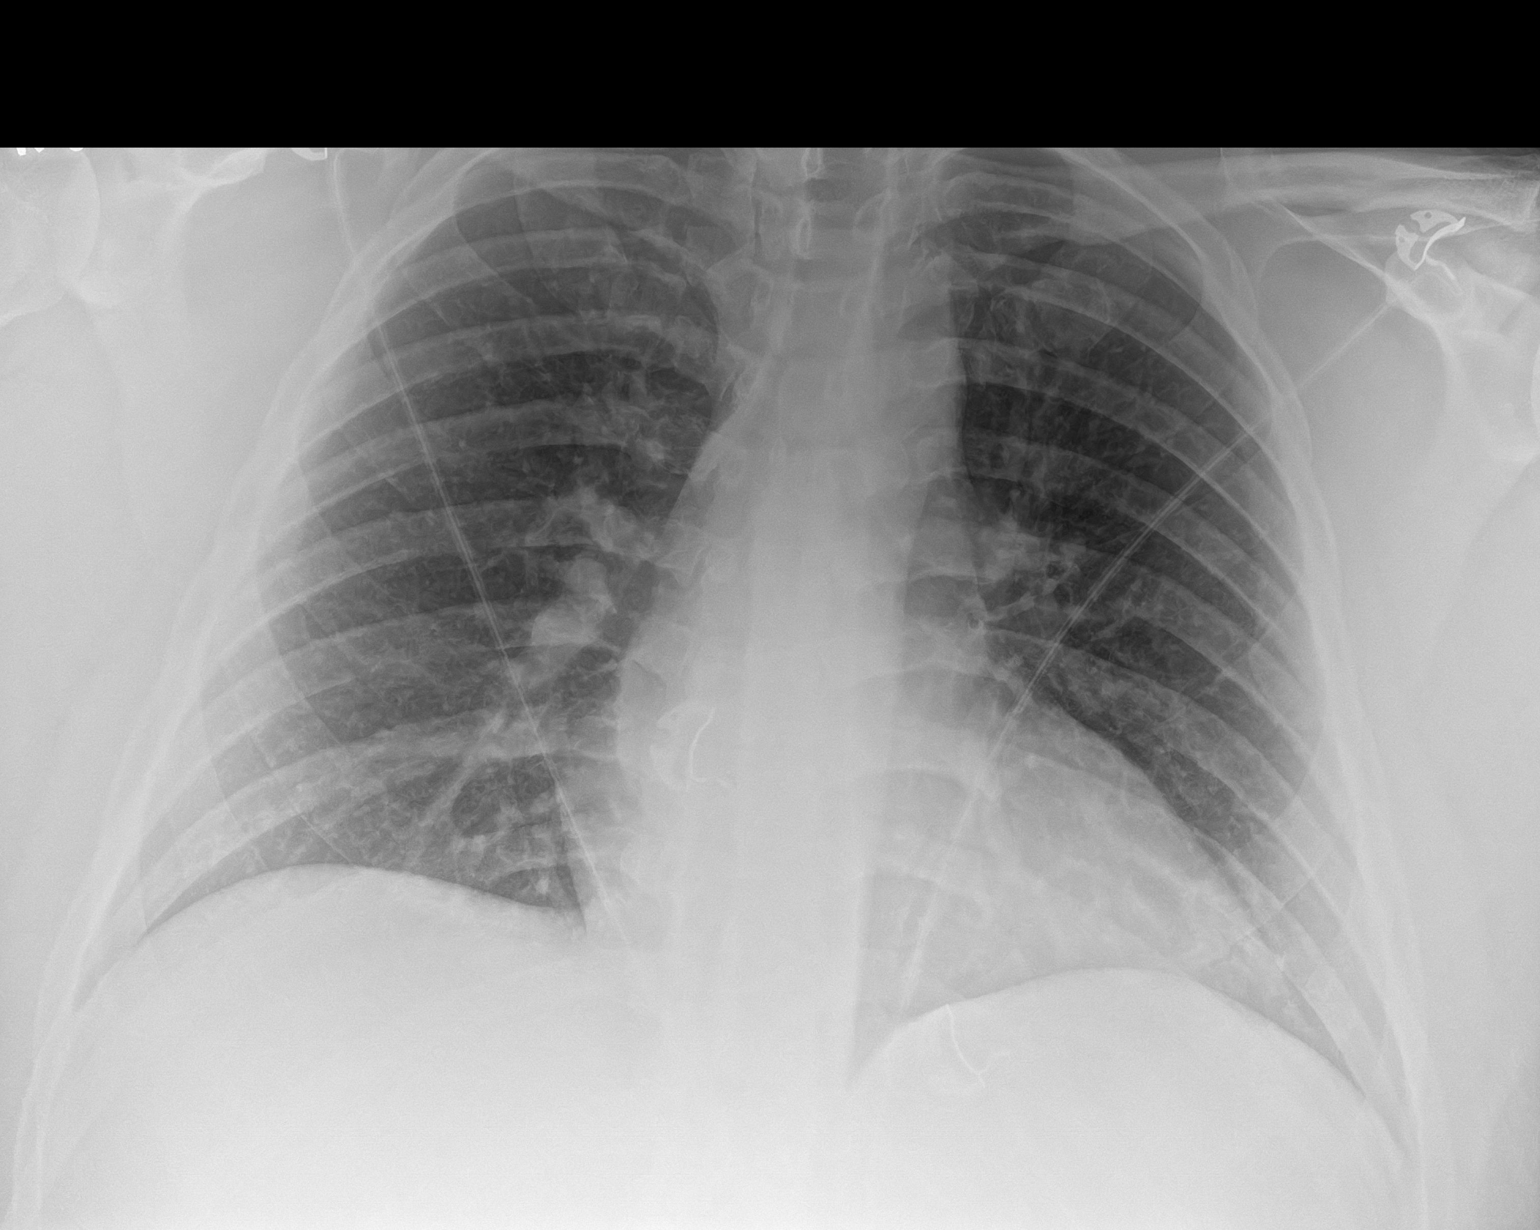

[1 of 1 positions shown; findings below may reference images not displayed]

FINDINGS: No consolidation, features of edema, pneumothorax, or effusion.
Pulmonary vascularity is normally distributed. The cardiomediastinal
contours are unremarkable. No acute osseous or soft tissue
abnormality. Telemetry leads overlie the chest.
IMPRESSION: No acute cardiopulmonary abnormality.

## 2023-03-20 IMAGING — US US ABDOMEN LIMITED
1 series · 14 of 25 positions shown · non-contrast
Comparison: 10/06/2020 CT

CLINICAL DATA: Follow-up of abnormal CT.

EXAM:
ULTRASOUND ABDOMEN LIMITED RIGHT UPPER QUADRANT

[Series 1: us abdomen limited ruq (liver/gb) · 14 of 46 slices shown]
[im 1/46]
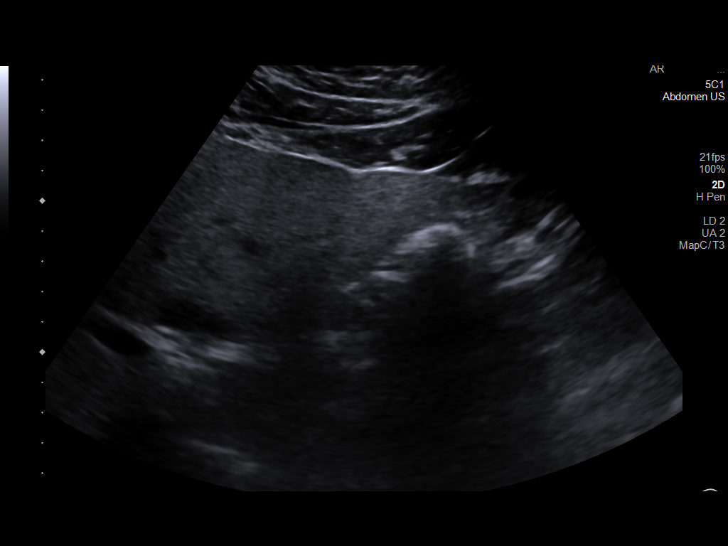
[im 4/46]
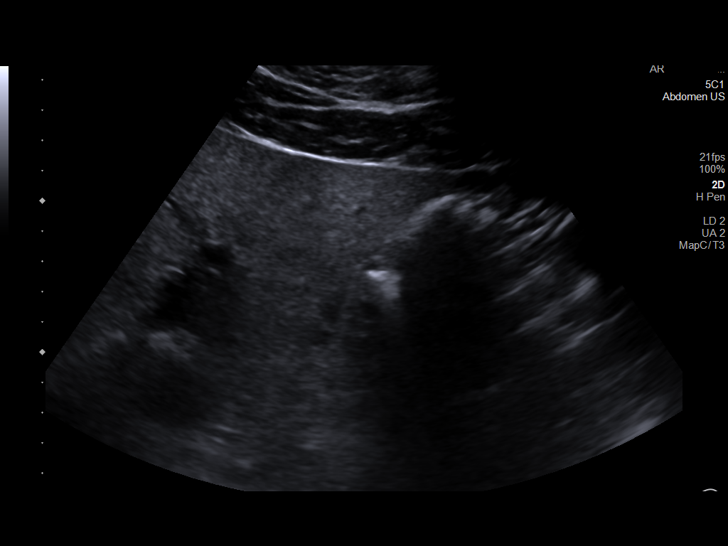
[im 8/46]
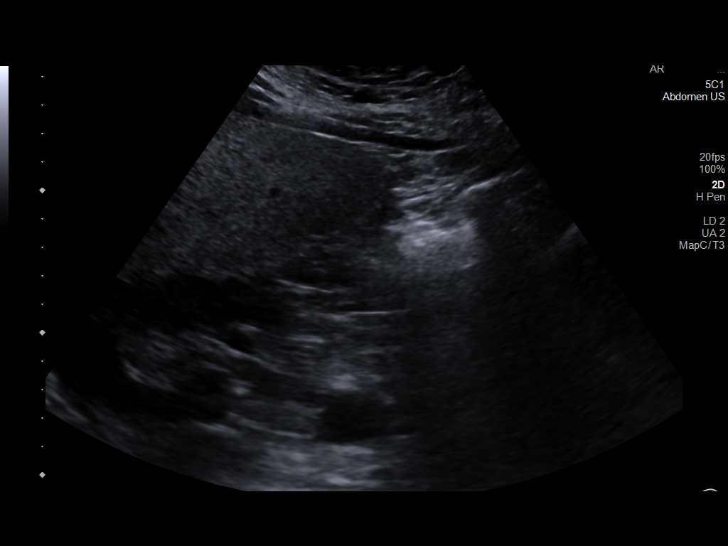
[im 12/46]
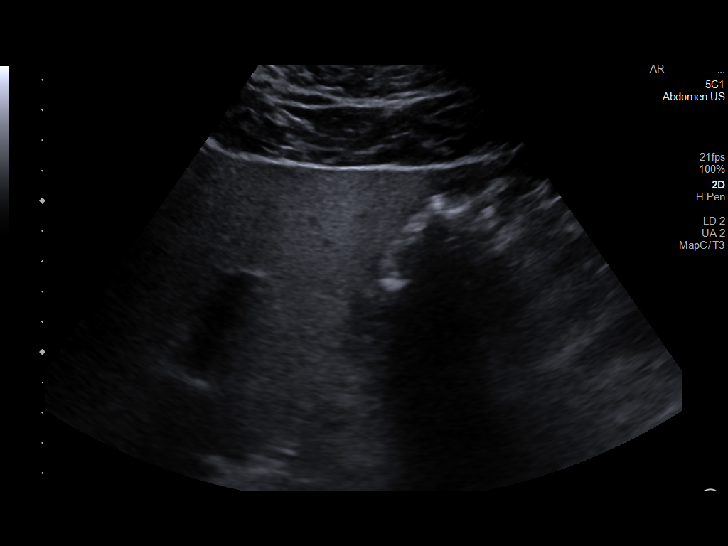
[im 16/46]
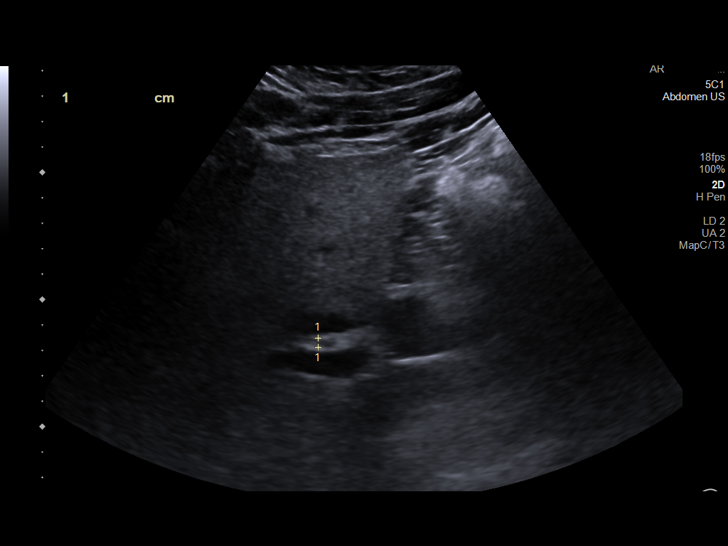
[im 17/46]
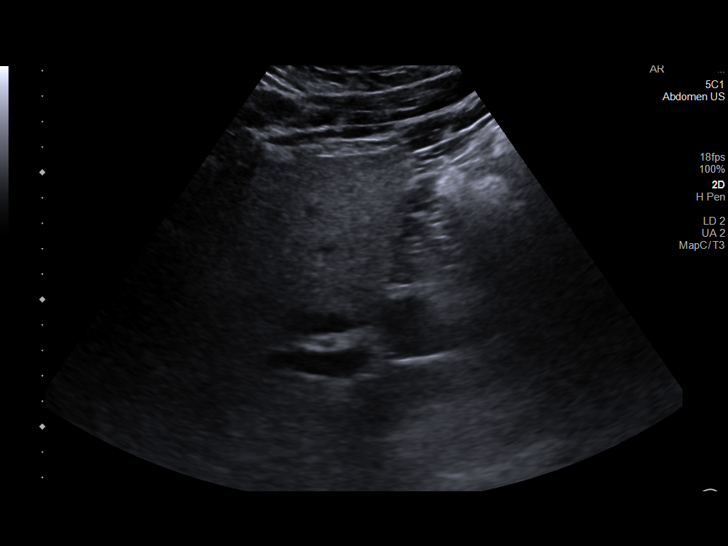
[im 21/46]
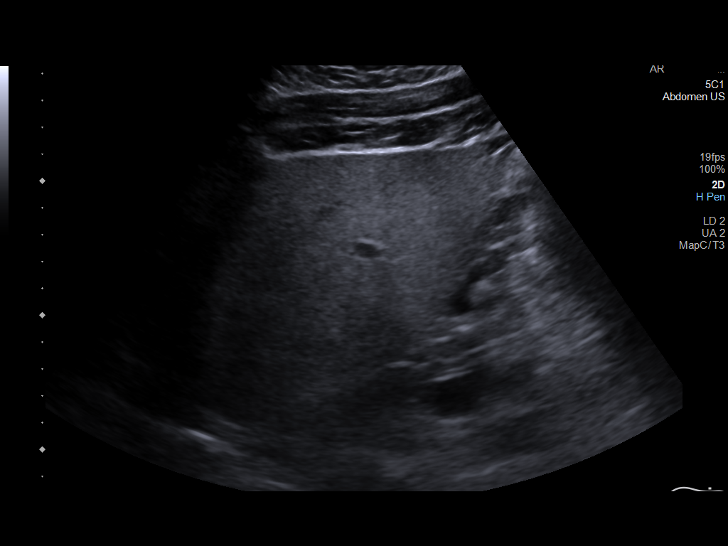
[im 25/46]
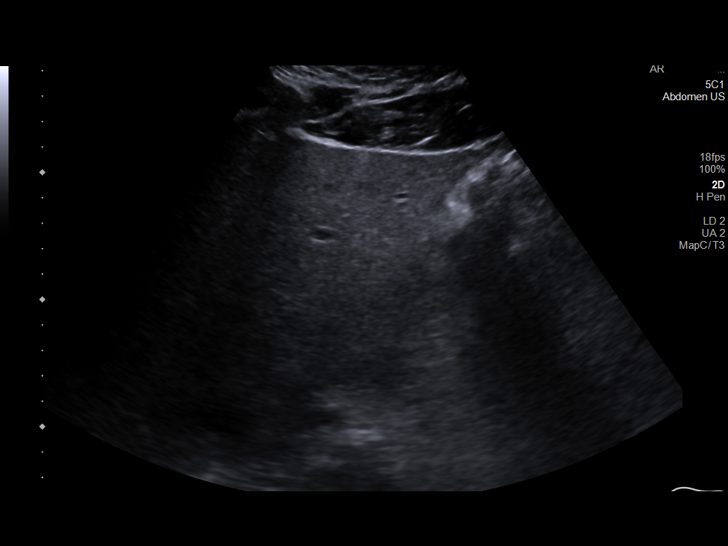
[im 29/46]
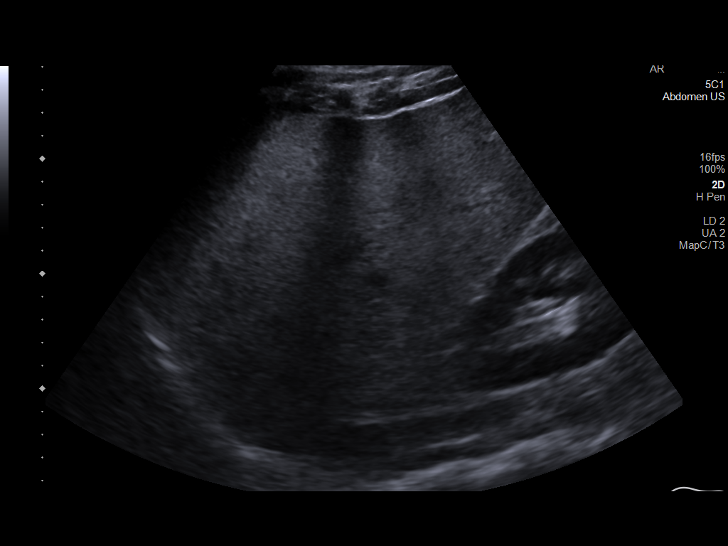
[im 31/46]
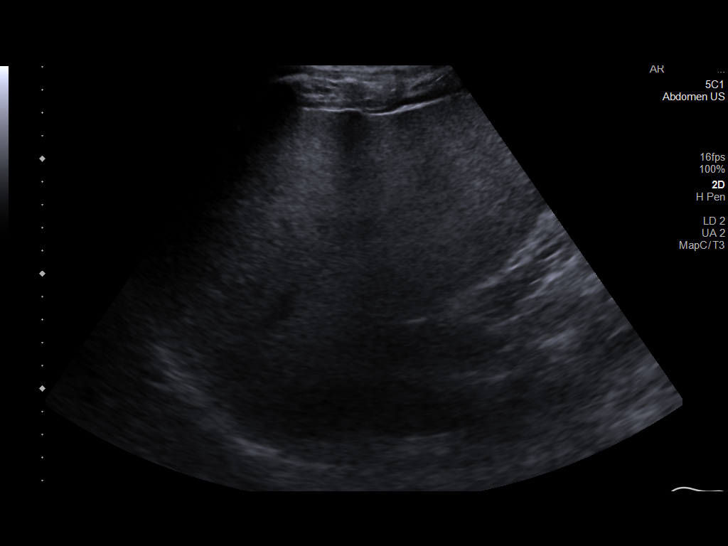
[im 34/46]
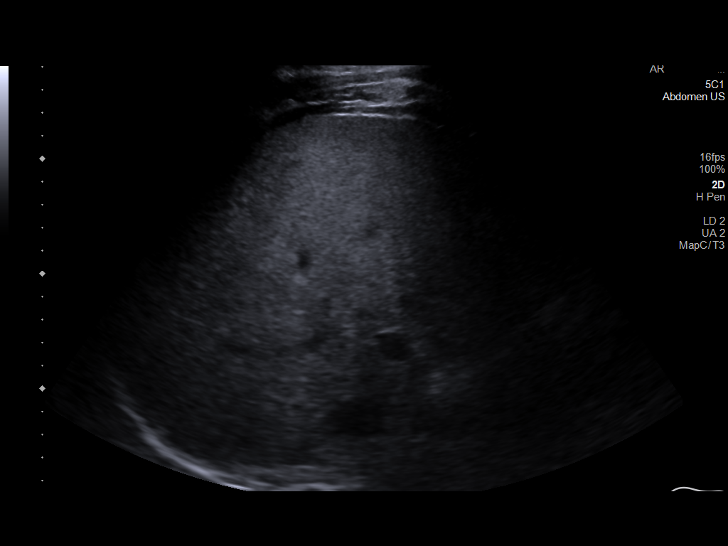
[im 38/46]
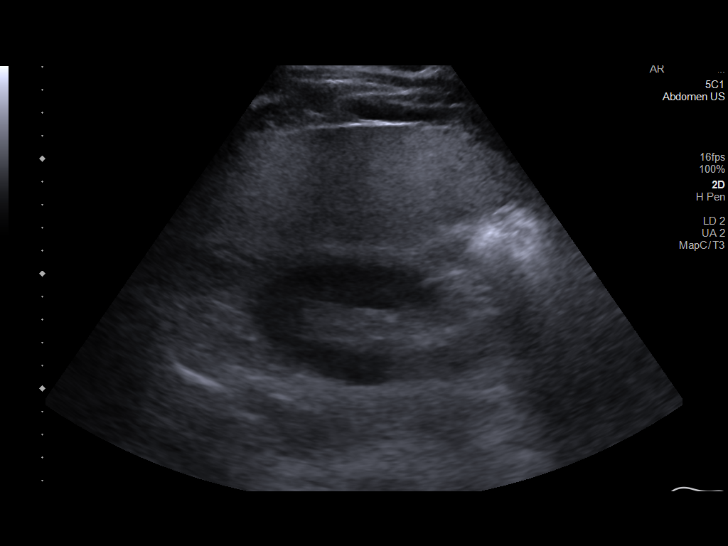
[im 42/46]
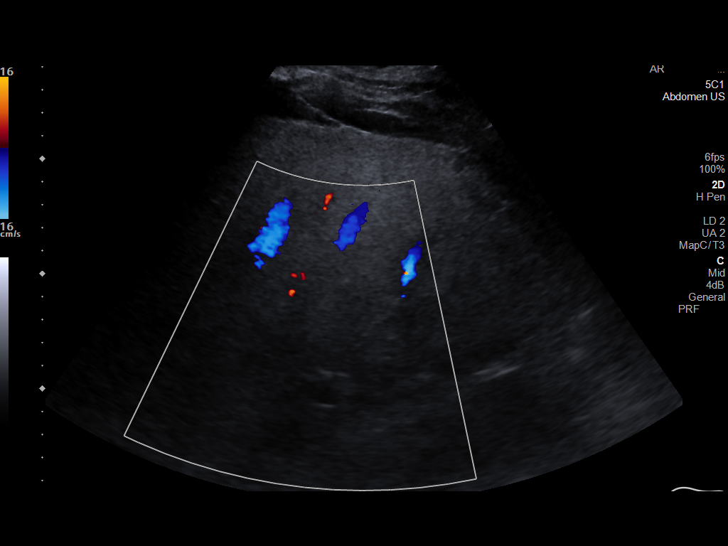
[im 46/46]
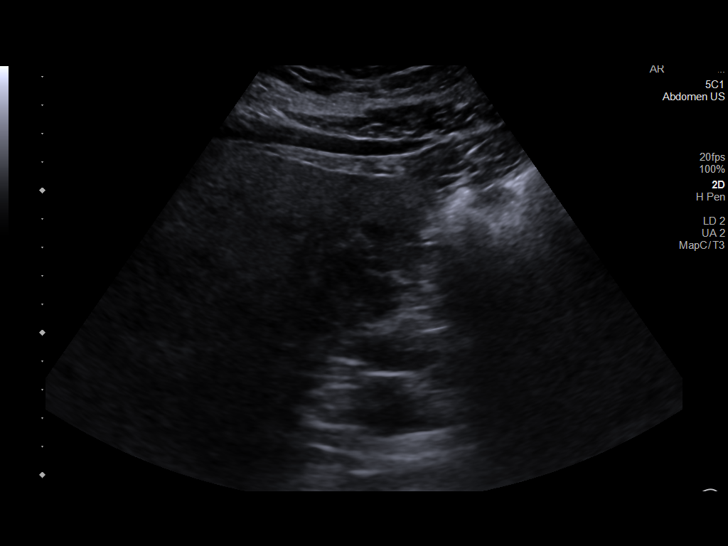

[14 of 25 positions shown; findings below may reference images not displayed]

FINDINGS: Gallbladder:

Stones of up to 3 mm within a contracted gallbladder. No
pericholecystic fluid. Sonographic Murphy's sign was not elicited.

Common bile duct:

Diameter: Normal, 4 mm.

Liver:

Increased hepatic echogenicity. Portal vein is patent on color
Doppler imaging with normal direction of blood flow towards the
liver.

Other: None.
IMPRESSION: 1. Contracted gallbladder with stones within. Presuming the patient
is NPO, this gallbladder contraction suggests a component of chronic
cholecystitis. No acute cholecystitis.
2. Hepatic steatosis.

## 2023-04-14 ENCOUNTER — Other Ambulatory Visit: Payer: Self-pay | Admitting: Medical Genetics

## 2023-04-15 ENCOUNTER — Other Ambulatory Visit (HOSPITAL_COMMUNITY): Payer: Self-pay | Attending: Medical Genetics

## 2023-05-20 ENCOUNTER — Telehealth: Payer: Self-pay | Admitting: *Deleted

## 2023-05-20 NOTE — Telephone Encounter (Signed)
Copied from CRM [id number]. Topic: Clinical - Medical Advice >> May 20, 2023  8:41 AM Maxwell Marion wrote: Reason for CRM: Patient's wife Coralee North called and would like to get a message to Dr. Lorin Picket to give her a call at [phone]. The patient was admitted to [facility] in [place] and they don't know what's going on but he had an acute episode of vertigo that isn't resolving with medication. She would feel better speaking with his primary doctor because she is thinking they may need to get him transferred someone else.

## 2023-05-20 NOTE — Telephone Encounter (Signed)
I did speak with the wife as requested Patient had severe vertigo still having dizziness difficulty walking and coordination I recommended a office visit 1:50 PM on Thursday please put him on the schedule thank you  They are aware they will follow-up

## 2023-05-21 ENCOUNTER — Ambulatory Visit (INDEPENDENT_AMBULATORY_CARE_PROVIDER_SITE_OTHER): Payer: BC Managed Care – PPO | Admitting: Family Medicine

## 2023-05-21 VITALS — BP 120/60 | Temp 98.7°F | Wt 358.0 lb

## 2023-05-21 DIAGNOSIS — R270 Ataxia, unspecified: Secondary | ICD-10-CM

## 2023-05-21 DIAGNOSIS — J342 Deviated nasal septum: Secondary | ICD-10-CM

## 2023-05-21 DIAGNOSIS — R42 Dizziness and giddiness: Secondary | ICD-10-CM

## 2023-05-21 NOTE — Progress Notes (Addendum)
Subjective:    Patient ID: Marvin Rivera, male    DOB: 10-13-1990, 33 y.o.   MRN: [id number]  Discussed the use of AI scribe software for clinical note transcription with the patient, who gave verbal consent to proceed.  History of Present Illness   The patient presented with a sudden onset of severe dizziness, nausea, and photophobia that began on a Tuesday. He described the dizziness as a sensation of the room spinning, which was exacerbated by opening his eyes and moving. The patient reported that he was unable to stand or move without feeling like he was going to fall over. He also experienced a sensation of heat and an urge to vomit. The patient found some relief by keeping his eyes closed and staying in a dark, quiet room.  The patient also reported a stomach ache, which he attributed to the nausea. He sought immediate medical attention due to the severity of his symptoms, which he described as feeling like he was "about to die." The patient was taken to the ER by a coworker.  In the ER, the patient underwent a series of tests, including blood work and a head scan. He was given IV fluids and anti-nausea medication. However, the patient reported that the initial treatment was not effective. The patient was admitted to the hospital and stayed overnight.  Upon discharge, the patient reported feeling much better, but still experienced some dizziness, particularly when walking. He also reported a distorted perception of distance, with objects appearing further away than they actually were. The patient also reported a decrease in the rapid eye movement that he had been experiencing.     He states with this headache began to have intense nausea photophobia and mild headache but no neck stiffness   Patient relates symptoms with deviated septum septum requesting referral to ENT has had this for several years Review of Systems     Objective:    Physical Exam   NEUROLOGICAL: Extraocular  movements intact with rapid side-to-side nystagmus. Finger to nose test performed without difficulty. Romberg test negative. Gait examination revealed difficulty with balance when looking up, described as feeling like going sideways.     On physical exam he does have some mild ataxia he wavers on the Romberg but it is negative his finger-to-nose is acceptable but he does have a couple times where the finger-to-nose coordination was not good he does relate mild diplopia when he looks up and when he looks far out. When he walks the hallway and turned around and walked back he walks slowly and does waver to some degree  No unilateral numbness or weakness detected on physical exam  deviated septum on physical exam     Assessment & Plan:  Assessment and Plan    Vertigo Sudden onset of severe dizziness, nausea, photophobia, and ataxia. Symptoms improved but not resolved. CT scan and lab work unremarkable. Suspected severe case of inner ear issue causing vertigo. -Schedule MRI to rule out secondary underlying issue. -If symptoms recur, consider referral to specialist or physical therapy for Epley maneuver training. -If MRI is unremarkable, expect gradual resolution of symptoms without need for surgical intervention.     His symptoms seem consistent with vertigo but because of the sudden onset as well as the persistent mild ataxia on physical exam need to do MRI to rule out the possibility of a stroke await the result Patient was in the hospital at [facility] had CT scan which was negative they recommended  a MRI but their MRI machine was broke he was instructed to be discharged and follow-up with his family doctor for further workup  No driving until total resolution of ataxia  Follow-up based around findings of the test and symptoms Patient also with septal deviation requests referral to ENT for this

## 2023-05-22 NOTE — Addendum Note (Signed)
Addended by: Nyoka Cowden on: 05/22/2023 08:46 AM   Modules accepted: Orders

## 2023-05-25 ENCOUNTER — Encounter: Payer: Self-pay | Admitting: Family Medicine

## 2023-05-25 ENCOUNTER — Ambulatory Visit (HOSPITAL_COMMUNITY)
Admission: RE | Admit: 2023-05-25 | Discharge: 2023-05-25 | Disposition: A | Discharge status: Home / Self Care | Payer: BC Managed Care – PPO | Source: Ambulatory Visit | Attending: Family Medicine | Admitting: Family Medicine

## 2023-05-25 DIAGNOSIS — R42 Dizziness and giddiness: Secondary | ICD-10-CM | POA: Insufficient documentation

## 2023-05-25 DIAGNOSIS — R270 Ataxia, unspecified: Secondary | ICD-10-CM | POA: Diagnosis present

## 2023-05-25 MED ORDER — GADOBUTROL 1 MMOL/ML IV SOLN
10.0000 mL | Freq: Once | INTRAVENOUS | Status: AC | PRN
  Administered 2023-05-25: 10 mL via INTRAVENOUS

## 2023-05-26 ENCOUNTER — Inpatient Hospital Stay: Payer: BC Managed Care – PPO | Admitting: Family Medicine

## 2023-05-27 ENCOUNTER — Other Ambulatory Visit: Payer: Self-pay

## 2023-05-27 DIAGNOSIS — D352 Benign neoplasm of pituitary gland: Secondary | ICD-10-CM

## 2023-05-27 DIAGNOSIS — R42 Dizziness and giddiness: Secondary | ICD-10-CM

## 2023-05-27 DIAGNOSIS — R270 Ataxia, unspecified: Secondary | ICD-10-CM

## 2023-06-04 LAB — CORTISOL, FREE: Cortisol, Free Dialysis, LCMS: 0.197 ug/dL

## 2023-06-04 LAB — TSH+FREE T4
Free T4: 0.93 ng/dL (ref 0.82–1.77)
TSH: 2.03 u[IU]/mL (ref 0.450–4.500)

## 2023-06-04 LAB — INSULIN-LIKE GROWTH FACTOR: Insulin-Like GF-1: 167 ng/mL (ref 95–290)

## 2023-06-04 LAB — PROLACTIN: Prolactin: 14.2 ng/mL (ref 3.9–22.7)

## 2023-06-04 LAB — TESTOSTERONE: Testosterone: 225 ng/dL — ABNORMAL LOW (ref 264–916)

## 2023-06-10 ENCOUNTER — Telehealth: Payer: Self-pay | Admitting: Family Medicine

## 2023-06-10 ENCOUNTER — Encounter: Payer: Self-pay | Admitting: Family Medicine

## 2023-06-10 NOTE — Telephone Encounter (Signed)
Nurses Please put into the reminder file and July for repeat MRI of the pituitary macroadenoma-this will be dependent on what Dr. Needed knows as well but go ahead and put this into the reminder file so it does not get overlooked thank you  Patient aware

## 2023-06-12 NOTE — Telephone Encounter (Signed)
Patient has appointment scheduled  06/15/23 with Dr Fransico Him

## 2023-06-12 NOTE — Telephone Encounter (Signed)
That is good-Dr. Isidoro Donning office can evaluate his macroadenoma and decide what needs to be done thank you

## 2023-06-15 ENCOUNTER — Encounter: Payer: Self-pay | Admitting: "Endocrinology

## 2023-06-15 ENCOUNTER — Ambulatory Visit (INDEPENDENT_AMBULATORY_CARE_PROVIDER_SITE_OTHER): Payer: BC Managed Care – PPO | Admitting: "Endocrinology

## 2023-06-15 VITALS — BP 122/80 | HR 72 | Ht 72.0 in | Wt 372.4 lb

## 2023-06-15 DIAGNOSIS — E782 Mixed hyperlipidemia: Secondary | ICD-10-CM | POA: Diagnosis not present

## 2023-06-15 DIAGNOSIS — R7303 Prediabetes: Secondary | ICD-10-CM | POA: Insufficient documentation

## 2023-06-15 DIAGNOSIS — D352 Benign neoplasm of pituitary gland: Secondary | ICD-10-CM | POA: Diagnosis not present

## 2023-06-15 NOTE — Progress Notes (Signed)
Endocrinology Consult Note                                            06/15/2023, 4:35 PM   Subjective:    Patient ID: Marvin Rivera, male    DOB: 10/29/1990, PCP Babs Sciara, MD   Past Medical History:  Diagnosis Date   Gallstones    Hypertension    Liver cyst 2009   Sleep apnea    Past Surgical History:  Procedure Laterality Date   BIOPSY  01/21/2023   Procedure: BIOPSY;  Surgeon: Franky Macho, MD;  Location: [facility];  Service: Endoscopy;;   CHOLECYSTECTOMY     COLONOSCOPY WITH PROPOFOL N/A 01/21/2023   Procedure: COLONOSCOPY WITH PROPOFOL;  Surgeon: Franky Macho, MD;  Location: [facility];  Service: Endoscopy;  Laterality: N/A;  10:30am;asa 2   POLYPECTOMY  01/21/2023   Procedure: POLYPECTOMY;  Surgeon: Franky Macho, MD;  Location: [facility];  Service: Endoscopy;;   Social History   Socioeconomic History   Marital status: Married    Spouse name: Not on file   Number of children: 3   Years of education: Not on file   Highest education level: Not on file  Occupational History   Not on file  Tobacco Use   Smoking status: Never    Passive exposure: Current   Smokeless tobacco: Never  Vaping Use   Vaping status: Never Used  Substance and Sexual Activity   Alcohol use: Not Currently    Comment: rarely   Drug use: No   Sexual activity: Yes  Other Topics Concern   Not on file  Social History Narrative   Not on file   Social Drivers of Health   Financial Resource Strain: Low Risk  (05/19/2023)   Received from [facility]   Overall Financial Resource Strain (CARDIA)    Difficulty of Paying Living Expenses: Not hard at all  Food Insecurity: No Food Insecurity (05/19/2023)   Received from [facility]   Hunger Vital Sign    Worried About Running Out of Food in the Last Year: Never true    Ran Out of Food in the Last Year: Never true  Transportation Needs: No Transportation Needs (05/19/2023)   Received from [facility] - Transportation    Lack of Transportation (Medical): No    Lack of Transportation (Non-Medical): No  Physical Activity: Inactive (05/19/2023)   Received from [facility]   Exercise Vital Sign    Days of Exercise per Week: 0 days    Minutes of Exercise per Session: 0 min  Stress: No Stress Concern Present (05/19/2023)   Received from [facility] of Occupational [facility] - Occupational Stress Questionnaire    Feeling of Stress : Only a little  Social Connections: Socially Integrated (05/19/2023)   Received from [facility]   Social Connection and Isolation Panel [NHANES]    Frequency of Communication with Friends and Family: More than three times a week    Frequency of Social Gatherings with Friends and Family: More than three times a week    Attends Religious Services: More than 4 times per year    Active Member of [employer] or Organizations: Yes    Attends Banker Meetings: More than  4 times per year    Marital Status: Married   Family History  Problem Relation Age of Onset   Lung cancer Mother 23   Atrial fibrillation Mother    Hypertension Mother    Atrial fibrillation Father    Sleep apnea Brother    Other Paternal Aunt        tumor (unspecified location)   Other Paternal Aunt        cardiac tumor (likely myxoma), carney complex   Other Paternal Uncle        cardiac tumor (likely myxoma)   Heart disease Paternal Grandmother        cardiac tumor (likely myxoma)   Other Paternal Grandmother        carney complex   Lung cancer Paternal Grandfather    Other Cousin        pituitary adenoma, carney complex, paternal first couin once removed   Outpatient Encounter Medications as of 06/15/2023  Medication Sig   Ibuprofen (ADVIL PO) Take by mouth as needed.   Melatonin 1 MG CHEW Chew by mouth at bedtime as needed.   No facility-administered encounter medications on file as of 06/15/2023.   ALLERGIES: Allergies  Allergen  Reactions   Bee Venom     yellowjackets   Compazine [Prochlorperazine] Anxiety    IV dose caused panicky feeling and agitation when treated at [facility] ER 05/19/23    VACCINATION STATUS: Immunization History  Administered Date(s) Administered   Tdap 03/12/2013, 07/15/2018    HPI Marvin Rivera is 33 y.o. male who presents today with a medical history as above. he is being seen in consultation for pituitary macroadenoma requested by Babs Sciara, MD.  Patient is accompanied by his wife to clinic.  He denies any prior history of pituitary, thyroid, adrenal dysfunction. Due to dizziness and symptoms of vertigo, he underwent MRI of the brain on May 22, 2023 which incidentally showed 1.4 cm hypoenhancing mass lesion in the pituitary gland. Patient did have symptoms including dizziness, some intermittent headaches, no visual field deficits, blurry vision, no nausea no vomiting.  Subsequent lab work revealed normal IGF-I, prolactin, thyroid function, however slightly suppressed total testosterone to 25. Patient is a father of 3 children.  He is physically active at baseline. His other medical problems include sleep apnea which required CPAP, obesity.  His recent A1c was also consistent with prediabetes at 5.9% in June 2024.  He has hyperlipidemia not on treatment. Currently denies chest pain, shortness of breath, nor any particular headaches.   Review of Systems  Constitutional: + Mildly fluctuating body weight, current BMI 50.51, + no fatigue, no subjective hyperthermia, no subjective hypothermia Eyes: no blurry vision, no xerophthalmia ENT: no sore throat, no nodules palpated in throat, no dysphagia/odynophagia, no hoarseness Cardiovascular: no Chest Pain, no Shortness of Breath, no palpitations, no leg swelling Respiratory: no cough, no shortness of breath Gastrointestinal: no Nausea/Vomiting/Diarhhea Musculoskeletal: no muscle/joint aches Skin: no rashes Neurological: no  tremors, no numbness, no tingling, no dizziness Psychiatric: no depression, no anxiety  Objective:       06/15/2023    1:31 PM 05/21/2023    2:03 PM 03/18/2023   10:59 AM  Vitals with BMI  Height 6\' 0"   6\' 0"   Weight 372 lbs 6 oz 358 lbs 369 lbs 13 oz  BMI 50.5  50.14  Systolic 122 120 846  Diastolic 80 60 84  Pulse 72  74    BP 122/80   Pulse 72  Ht 6' (1.829 m)   Wt (!) 372 lb 6.4 oz (168.9 kg)   BMI 50.51 kg/m   Wt Readings from Last 3 Encounters:  06/15/23 (!) 372 lb 6.4 oz (168.9 kg)  05/21/23 (!) 358 lb (162.4 kg)  03/18/23 (!) 369 lb 12.8 oz (167.7 kg)    Physical Exam  Constitutional:  Body mass index is 50.51 kg/m.,  not in acute distress, normal state of mind Eyes: PERRLA, EOMI, no exophthalmos ENT: moist mucous membranes, no gross thyromegaly, no gross cervical lymphadenopathy Cardiovascular: normal precordial activity, Regular Rate and Rhythm, no Murmur/Rubs/Gallops Respiratory:  adequate breathing efforts, no gross chest deformity, Clear to auscultation bilaterally Gastrointestinal: abdomen soft, Non -tender, No distension, Bowel Sounds present, no gross organomegaly Musculoskeletal: no gross deformities, strength intact in all four extremities, no peripheral edema Skin: moist, warm, no rashes Neurological: no tremor with outstretched hands, Deep tendon reflexes normal in bilateral lower extremities.  CMP ( most recent) CMP     Component Value Date/Time   NA 135 12/13/2022 0045   NA 138 10/13/2022 0931   K 3.6 12/13/2022 0045   CL 102 12/13/2022 0045   CO2 21 (L) 12/13/2022 0045   GLUCOSE 107 (H) 12/13/2022 0045   BUN 16 12/13/2022 0045   BUN 13 10/13/2022 0931   CREATININE 0.96 12/13/2022 0045   CALCIUM 9.1 12/13/2022 0045   PROT 7.0 12/13/2022 0045   PROT 6.7 10/13/2022 0931   ALBUMIN 4.2 12/13/2022 0045   ALBUMIN 4.4 10/13/2022 0931   AST 26 12/13/2022 0045   ALT 45 (H) 12/13/2022 0045   ALKPHOS 59 12/13/2022 0045   BILITOT 1.0 12/13/2022  0045   BILITOT 1.2 10/13/2022 0931   EGFR 100 10/13/2022 0931   GFRNONAA >60 12/13/2022 0045     Diabetic Labs (most recent): Lab Results  Component Value Date   HGBA1C 5.9 (H) 10/13/2022     Lipid Panel ( most recent) Lipid Panel     Component Value Date/Time   CHOL 199 10/13/2022 0931   TRIG 157 (H) 10/13/2022 0931   HDL 36 (L) 10/13/2022 0931   CHOLHDL 5.5 (H) 10/13/2022 0931   LDLCALC 135 (H) 10/13/2022 0931   LABVLDL 28 10/13/2022 0931      Lab Results  Component Value Date   TSH 2.030 05/29/2023   TSH 2.150 10/13/2022   FREET4 0.93 05/29/2023   FREET4 0.92 10/13/2022        MRI of the brain with and without contrast on May 25, 2023. IMPRESSION: 1. 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 Endocrine function tests are recommended. Follow-up pituitary protocol MRI with and without contrast should be performed in 6-12 months. This follows ACR consensus guidelines: Management of Incidental Pituitary Findings on CT, MRI and F18-FDG PET: A White Paper of the ACR Incidental Findings Committee. J Am Coll Radiol 2018; 15: 966-72. 2. Otherwise unremarkable appearance of the brain. No acute intracranial abnormality.  Assessment & Plan:   1. Pituitary macroadenoma (HCC) (Primary)  2.  Prediabetes   3.  Hyperlipidemia   4.  Obesity  - VELDON WAGER  is being seen at a kind request of Luking, Jonna Coup, MD. - I have reviewed his available endocrine records and clinically evaluated the patient. I discussed his lab and imaging studies and findings with the couple. - Based on these reviews, he has pituitary macroadenoma. Based on the reviews of his endocrine workup, it appears that this adenoma is nonfunctioning.  It might have  some pressure effect in light of the fact that he presents with mild hypogonadism, and nonspecific CNS symptoms.    -His prolactin level is normal, no clear utility for dopamine receptor agonists at this time.    -There is some possibility that he might have had this lesion for long-term and was just incidentally discovered recently. He will benefit from a consult with neurosurgery.  I discussed and made arrangement for him to see Dr. Lisbeth Renshaw at [facility] neurosurgery. If he is determined to be a nonsurgical patient, he will need follow-up with repeat MRI at least in a year as well as endocrine evaluation with measurement of hormones including: A.m. cortisol, prolactin, thyroid function test, total testosterone.  This patient has metabolic syndrome-complicated by sleep apnea which required CPAP.  I briefly discussed the need for him to change lifestyle to prevent progression into type 2 diabetes.  He does have severe dyslipidemia.   He is willing to explore lifestyle modification before medications.  He will be offered a complete package of lifestyle medicine during his return visit.  - he is advised to maintain close follow up with Babs Sciara, MD for primary care needs.   -Thank you for involving me in the care of this pleasant patient.  Time spent with the patient: 61 minutes, of which >50% was spent in  counseling him about his pituitary macroadenoma, prediabetes, hyperlipidemia and the rest in obtaining information about his symptoms, reviewing his previous labs/studies ( including abstractions from other facilities),  evaluations, and treatments,  and developing a plan to confirm diagnosis and long term treatment based on the latest standards of care/guidelines; and documenting his care.  Marvin Rivera participated in the discussions, expressed understanding, and voiced agreement with the above plans.  All questions were answered to his satisfaction. he is encouraged to contact clinic should he have any questions or concerns prior to his return visit.  Follow up plan: Return in about 3 months (around 09/12/2023) for F/U with Labs after Surgery.   Marquis Lunch, MD [facility] [address] [place], [place] [address] Phone: [phone]  Fax: [phone]     06/15/2023, 4:35 PM  This note was partially dictated with voice recognition software. Similar sounding words can be transcribed inadequately or may not  be corrected upon review.

## 2023-06-24 ENCOUNTER — Telehealth: Payer: Self-pay | Admitting: "Endocrinology

## 2023-06-24 NOTE — Telephone Encounter (Signed)
 Pts wife called asking about status of neurosurgery referral, looked like referral was closed but with no explanation, so sent [facility] a teams.  Debbe Odea checked into it and responded with  "Ok so it seems that it was sent to [facility] and then it [employer] [facility]) sent it to [facility]. She did a release from within the referral and sometimes those don't always work so I will give them a call and let you know what they say    I called [facility] to check the status of this referral but it went straight to their referral voicemail. A message was left for them with both my direct line and the office phone number."  called pts wife and let her know that we are waiting to hear back from them.

## 2023-07-06 ENCOUNTER — Telehealth: Payer: Self-pay | Admitting: "Endocrinology

## 2023-07-06 NOTE — Telephone Encounter (Signed)
 Pt states they saw neurosurgeon today and he stated he did not think surgery was necessary at this time, but would repeat MRI in 6 months.  Currently scheduled for May here for post op labs and appt.  Do you need to see him earlier or keep current May appt?

## 2023-07-06 NOTE — Telephone Encounter (Signed)
 Let pt know to keep May appt and do labs

## 2023-07-08 ENCOUNTER — Ambulatory Visit (INDEPENDENT_AMBULATORY_CARE_PROVIDER_SITE_OTHER): Payer: BC Managed Care – PPO | Admitting: Otolaryngology

## 2023-07-08 ENCOUNTER — Encounter (INDEPENDENT_AMBULATORY_CARE_PROVIDER_SITE_OTHER): Payer: Self-pay | Admitting: Otolaryngology

## 2023-07-08 VITALS — BP 165/90 | HR 67 | Ht 72.0 in | Wt 374.0 lb

## 2023-07-08 DIAGNOSIS — J3489 Other specified disorders of nose and nasal sinuses: Secondary | ICD-10-CM

## 2023-07-08 DIAGNOSIS — G4733 Obstructive sleep apnea (adult) (pediatric): Secondary | ICD-10-CM

## 2023-07-08 DIAGNOSIS — J342 Deviated nasal septum: Secondary | ICD-10-CM | POA: Diagnosis not present

## 2023-07-08 DIAGNOSIS — Z91198 Patient's noncompliance with other medical treatment and regimen for other reason: Secondary | ICD-10-CM | POA: Diagnosis not present

## 2023-07-08 DIAGNOSIS — R0981 Nasal congestion: Secondary | ICD-10-CM | POA: Diagnosis not present

## 2023-07-08 DIAGNOSIS — J343 Hypertrophy of nasal turbinates: Secondary | ICD-10-CM

## 2023-07-08 DIAGNOSIS — J3089 Other allergic rhinitis: Secondary | ICD-10-CM

## 2023-07-08 DIAGNOSIS — R0982 Postnasal drip: Secondary | ICD-10-CM

## 2023-07-08 MED ORDER — LEVOCETIRIZINE DIHYDROCHLORIDE 5 MG PO TABS: 5.0000 mg | ORAL_TABLET | Freq: Every evening | ORAL | 6 refills | Status: DC

## 2023-07-08 MED ORDER — FLUTICASONE PROPIONATE 50 MCG/ACT NA SUSP: 2.0000 | Freq: Every day | NASAL | 6 refills | Status: DC
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
## 2023-07-10 ENCOUNTER — Other Ambulatory Visit: Payer: Self-pay | Admitting: Family Medicine

## 2023-07-10 MED ORDER — AMOXICILLIN 500 MG PO CAPS: 500.0000 mg | ORAL_CAPSULE | Freq: Two times a day (BID) | ORAL | 0 refills | Status: AC

## 2023-08-06 ENCOUNTER — Ambulatory Visit: Admitting: Nurse Practitioner

## 2023-08-06 VITALS — BP 131/76 | HR 82 | Ht 72.0 in | Wt 370.0 lb

## 2023-08-06 DIAGNOSIS — J208 Acute bronchitis due to other specified organisms: Secondary | ICD-10-CM

## 2023-08-06 DIAGNOSIS — B9689 Other specified bacterial agents as the cause of diseases classified elsewhere: Secondary | ICD-10-CM | POA: Diagnosis not present

## 2023-08-06 MED ORDER — AZITHROMYCIN 250 MG PO TABS: ORAL_TABLET | ORAL | 0 refills | Status: DC

## 2023-08-06 MED ORDER — FLUTICASONE-SALMETEROL 100-50 MCG/ACT IN AEPB: 1.0000 | INHALATION_SPRAY | Freq: Two times a day (BID) | RESPIRATORY_TRACT | 0 refills | Status: DC

## 2023-08-06 MED ORDER — PREDNISONE 20 MG PO TABS: ORAL_TABLET | ORAL | 0 refills | Status: DC

## 2023-08-06 NOTE — Progress Notes (Unsigned)
 Subjective:    Patient ID: Marvin Rivera, male    DOB: [birth year], 33 y.o.   MRN: [id number]  HPI Presents for complaints of cough for the past 2 weeks.  Feels like his chest is congested.  Sounds like "pop rocks "when he breathes out.  Occasional wheezing.  Has noticed more shortness of breath with regular activities.  No chest pain or tightness.  No edema.  No orthopnea.  Has OSA and wears his CPAP nightly.  No ear pain or sore throat.  No sinus area headache.  Mild headache at times with prolonged cough only.  Cough was worse yesterday, had a 10-minute spell of persistent cough, "felt something was in his throat".  No difficulty swallowing.  Denies any acid reflux symptoms.  Producing dark green sputum at times.  No known contacts.  Patient works in [employer]. Social History   Tobacco Use   Smoking status: Never    Passive exposure: Current   Smokeless tobacco: Never  Vaping Use   Vaping status: Never Used  Substance Use Topics   Alcohol use: Not Currently    Comment: rarely   Drug use: No      Review of Systems  Constitutional:  Negative for fever.  HENT:  Positive for congestion. Negative for ear pain, sinus pressure, sinus pain and sore throat.   Respiratory:  Positive for cough, shortness of breath and wheezing. Negative for chest tightness.   Cardiovascular:  Negative for chest pain and leg swelling.  Gastrointestinal:  Negative for abdominal pain.  Neurological:  Positive for headaches.       Objective:   Physical Exam NAD.  Alert, oriented.  Right TM normal limit.  Left TM mildly retracted, no erythema.  Pharynx mildly injected with cloudy PND noted.  Neck supple with mild soft anterior cervical adenopathy.  Lungs scattered coarse expiratory crackles noted throughout lung feels slightly more on the left.  No tachypnea.  Normal color.  Frequent cough noted with deep breath.  Heart regular rate rhythm.  No epigastric area tenderness on exam.  Lower extremities no  edema. Today's Vitals   08/06/23 1556  BP: 131/76  Pulse: 82  SpO2: 97%  Weight: (!) 370 lb (167.8 kg)  Height: 6' (1.829 m)   Body mass index is 50.18 kg/m.        Assessment & Plan:   Problem List Items Addressed This Visit       Respiratory   Acute bacterial bronchitis - Primary   Relevant Medications   azithromycin (ZITHROMAX Z-PAK) 250 MG tablet   Meds ordered this encounter  Medications   azithromycin (ZITHROMAX Z-PAK) 250 MG tablet    Sig: Take 2 tablets (500 mg) on  Day 1,  followed by 1 tablet (250 mg) once daily on Days 2 through 5.    Dispense:  6 each    Refill:  0    Supervising Provider:   Lilyan Punt A [[id number]]   predniSONE (DELTASONE) 20 MG tablet    Sig: Take 2 tabs (40 mg total) po once daily x 5 days    Dispense:  10 tablet    Refill:  0    Supervising Provider:   Lilyan Punt A [[id number]]   fluticasone-salmeterol (WIXELA INHUB) 100-50 MCG/ACT AEPB    Sig: Inhale 1 puff into the lungs 2 (two) times daily.    Dispense:  60 each    Refill:  0    Supervising Provider:   Lilyan Punt A [phone]  Start Z-Pak to cover possible atypical bronchitis. Start short course of oral prednisone.  Begin Wixela inhaler as directed.  Continue this for several weeks until symptoms have resolved.  Reminded patient to brush his teeth or rinse out mouth after use.  OTC meds as directed for cough. Warning signs reviewed.  Call back next week if no improvement, sooner if worse. Return if symptoms worsen or fail to improve.

## 2023-08-07 ENCOUNTER — Encounter: Payer: Self-pay | Admitting: Nurse Practitioner

## 2023-08-07 DIAGNOSIS — B9689 Other specified bacterial agents as the cause of diseases classified elsewhere: Secondary | ICD-10-CM | POA: Insufficient documentation

## 2023-09-07 ENCOUNTER — Telehealth: Payer: Self-pay | Admitting: "Endocrinology

## 2023-09-07 NOTE — Telephone Encounter (Signed)
 Can you see if pt's had surgery? He has appt next week to follow up with labs after surgery

## 2023-09-07 NOTE — Telephone Encounter (Signed)
 Left a message requesting pt return call to the office.

## 2023-09-09 NOTE — Telephone Encounter (Signed)
 Pt made aware

## 2023-09-09 NOTE — Telephone Encounter (Signed)
 Pt stated surgeon did not suggest surgery at this time but wanted to continue to monitor. Does pt need to keep his appt with you and have labs drawn?

## 2023-09-12 LAB — COMPREHENSIVE METABOLIC PANEL WITH GFR
ALT: 39 IU/L (ref 0–44)
AST: 31 IU/L (ref 0–40)
Albumin: 4.7 g/dL (ref 4.1–5.1)
Alkaline Phosphatase: 76 IU/L (ref 44–121)
BUN/Creatinine Ratio: 11 (ref 9–20)
BUN: 11 mg/dL (ref 6–20)
Bilirubin Total: 0.9 mg/dL (ref 0.0–1.2)
CO2: 23 mmol/L (ref 20–29)
Calcium: 9.5 mg/dL (ref 8.7–10.2)
Chloride: 103 mmol/L (ref 96–106)
Creatinine, Ser: 0.97 mg/dL (ref 0.76–1.27)
Globulin, Total: 2 g/dL (ref 1.5–4.5)
Glucose: 87 mg/dL (ref 70–99)
Potassium: 4.4 mmol/L (ref 3.5–5.2)
Sodium: 138 mmol/L (ref 134–144)
Total Protein: 6.7 g/dL (ref 6.0–8.5)
eGFR: 104 mL/min/{1.73_m2} (ref >59)

## 2023-09-12 LAB — TESTOSTERONE, FREE, TOTAL, SHBG
Sex Hormone Binding: 19.2 nmol/L (ref 16.5–55.9)
Testosterone, Free: 8.5 pg/mL — ABNORMAL LOW (ref 8.7–25.1)
Testosterone: 303 ng/dL (ref 264–916)

## 2023-09-12 LAB — CORTISOL-AM, BLOOD: Cortisol - AM: 12.4 ug/dL (ref 6.2–19.4)

## 2023-09-12 LAB — PROLACTIN: Prolactin: 17.7 ng/mL (ref 3.9–22.7)

## 2023-09-12 LAB — TSH: TSH: 3.12 u[IU]/mL (ref 0.450–4.500)

## 2023-09-12 LAB — T4, FREE: Free T4: 0.87 ng/dL (ref 0.82–1.77)

## 2023-09-14 ENCOUNTER — Ambulatory Visit (INDEPENDENT_AMBULATORY_CARE_PROVIDER_SITE_OTHER): Payer: BC Managed Care – PPO | Admitting: "Endocrinology

## 2023-09-14 ENCOUNTER — Encounter: Payer: Self-pay | Admitting: "Endocrinology

## 2023-09-14 VITALS — BP 134/84 | HR 84 | Ht 72.0 in | Wt 379.8 lb

## 2023-09-14 DIAGNOSIS — E782 Mixed hyperlipidemia: Secondary | ICD-10-CM | POA: Diagnosis not present

## 2023-09-14 DIAGNOSIS — R7303 Prediabetes: Secondary | ICD-10-CM | POA: Diagnosis not present

## 2023-09-14 DIAGNOSIS — D352 Benign neoplasm of pituitary gland: Secondary | ICD-10-CM | POA: Diagnosis not present

## 2023-09-14 NOTE — Progress Notes (Signed)
 09/14/2023, 4:41 PM  Endocrinology follow-up note   Subjective:    Patient ID: Marvin Rivera, male    DOB: 03-13-1991, PCP Marvin Brasil, MD   Past Medical History:  Diagnosis Date   Gallstones    Hypertension    Liver cyst 2009   Sleep apnea    Past Surgical History:  Procedure Laterality Date   BIOPSY  01/21/2023   Procedure: BIOPSY;  Surgeon: Marvin Lias, MD;  Location: [facility];  Service: Endoscopy;;   CHOLECYSTECTOMY     COLONOSCOPY WITH PROPOFOL  N/A 01/21/2023   Procedure: COLONOSCOPY WITH PROPOFOL ;  Surgeon: Marvin Lias, MD;  Location: [facility];  Service: Endoscopy;  Laterality: N/A;  10:30am;asa 2   POLYPECTOMY  01/21/2023   Procedure: POLYPECTOMY;  Surgeon: Marvin Lias, MD;  Location: [facility];  Service: Endoscopy;;   Social History   Socioeconomic History   Marital status: Married    Spouse name: Not on file   Number of children: 3   Years of education: Not on file   Highest education level: Not on file  Occupational History   Not on file  Tobacco Use   Smoking status: Never    Passive exposure: Current   Smokeless tobacco: Never  Vaping Use   Vaping status: Never Used  Substance and Sexual Activity   Alcohol use: Not Currently    Comment: rarely   Drug use: No   Sexual activity: Yes  Other Topics Concern   Not on file  Social History Narrative   Not on file   Social Drivers of Health   Financial Resource Strain: Low Risk  (05/19/2023)   Received from [facility]   Overall Financial Resource Strain (CARDIA)    Difficulty of Paying Living Expenses: Not hard at all  Food Insecurity: No Food Insecurity (05/19/2023)   Received from [facility]   Hunger Vital Sign    Worried About Running Out of Food in the Last Year: Never true    Ran Out of Food in the Last Year: Never true  Transportation Needs: No Transportation Needs (05/19/2023)   Received from [facility] - Transportation    Lack of Transportation (Medical): No    Lack of Transportation (Non-Medical): No  Physical Activity: Inactive (05/19/2023)   Received from [facility]   Exercise Vital Sign    Days of Exercise per Week: 0 days    Minutes of Exercise per Session: 0 min  Stress: No Stress Concern Present (05/19/2023)   Received from [facility] - Occupational Stress Questionnaire    Feeling of Stress : Only a little  Social Connections: Socially Integrated (05/19/2023)   Received from [facility]   Social Connection and Isolation Panel [NHANES]    Frequency of Communication with Friends and Family: More than three times a week    Frequency of Social Gatherings with Friends and Family: More than three times a week    Attends Religious Services: More than 4 times per year    Active Member of [employer] or Organizations: Yes    Attends Banker Meetings: More  than 4 times per year    Marital Status: Married   Family History  Problem Relation Age of Onset   Lung cancer Mother 73   Atrial fibrillation Mother    Hypertension Mother    Atrial fibrillation Father    Sleep apnea Brother    Other Paternal Aunt        tumor (unspecified location)   Other Paternal Aunt        cardiac tumor (likely myxoma), carney complex   Other Paternal Uncle        cardiac tumor (likely myxoma)   Heart disease Paternal Grandmother        cardiac tumor (likely myxoma)   Other Paternal Grandmother        carney complex   Lung cancer Paternal Grandfather    Other Cousin        pituitary adenoma, carney complex, paternal first couin once removed   Outpatient Encounter Medications as of 09/14/2023  Medication Sig   MELATONIN GUMMIES PO Take by mouth as needed.   [DISCONTINUED] azithromycin  (ZITHROMAX  Z-PAK) 250 MG tablet Take 2 tablets (500 mg) on  Day 1,  followed by 1 tablet (250 mg) once daily on Days 2 through 5.    [DISCONTINUED] fluticasone -salmeterol (WIXELA INHUB) 100-50 MCG/ACT AEPB Inhale 1 puff into the lungs 2 (two) times daily.   [DISCONTINUED] predniSONE  (DELTASONE ) 20 MG tablet Take 2 tabs (40 mg total) po once daily x 5 days   No facility-administered encounter medications on file as of 09/14/2023.   ALLERGIES: Allergies  Allergen Reactions   Bee Venom     yellowjackets   Compazine [Prochlorperazine] Anxiety    IV dose caused panicky feeling and agitation when treated at [facility] ER 05/19/23    VACCINATION STATUS: Immunization History  Administered Date(s) Administered   Tdap 03/12/2013, 07/15/2018    HPI Marvin Rivera is 33 y.o. male who presents today with a medical history as above. he is being seen in follow up after he was seen in consultation for pituitary macroadenoma requested by Marvin Brasil, MD.  Due to dizziness and symptoms of vertigo, he underwent MRI of the brain on May 22, 2023 which incidentally showed 1.4 cm hypoenhancing mass lesion in the pituitary gland. Patient did have symptoms including dizziness, some intermittent headaches, no visual field deficits, blurry vision, no nausea no vomiting.  Subsequent lab work revealed normal IGF-I, prolactin, thyroid function, however slightly suppressed total testosterone  to 225. He was sent to neurosurgery during his last visit. He was een and evaluated by Dr. Nat Rivera.  The decision was to do expectant management, with plan to review imaging and visit in 6 months. He does not have acute complaints today.  His previsit endocrine labs remain stable.  He denies any visual field loss, nor headaches. Patient is a father of 3 children.  He is physically active at baseline. His other medical problems include sleep apnea which required CPAP, obesity.  His recent A1c was also consistent with prediabetes at 5.9% in June 2024.  He has hyperlipidemia not on treatment. Currently denies chest pain, shortness of breath, nor any  particular headaches.   Review of Systems  Constitutional: + Mildly fluctuating body weight, current BMI 50.51, + no fatigue, no subjective hyperthermia, no subjective hypothermia Eyes: no blurry vision, no xerophthalmia   Objective:       09/14/2023    3:51 PM 09/14/2023    3:32 PM 08/06/2023    3:56 PM  Vitals with BMI  Height  6\' 0"  6\' 0"   Weight  379 lbs 13 oz 370 lbs  BMI  51.5 50.17  Systolic 134 148 161  Diastolic 84 80 76  Pulse  84 82    BP 134/84 Comment: L arm with manual cuff.  Pulse 84   Ht 6' (1.829 m)   Wt (!) 379 lb 12.8 oz (172.3 kg)   BMI 51.51 kg/m   Wt Readings from Last 3 Encounters:  09/14/23 (!) 379 lb 12.8 oz (172.3 kg)  08/06/23 (!) 370 lb (167.8 kg)  07/08/23 (!) 374 lb (169.6 kg)    Physical Exam  Constitutional:  Body mass index is 51.51 kg/m.,  not in acute distress, normal state of mind Eyes: PERRLA, EOMI, no exophthalmos ENT: moist mucous membranes, no gross thyromegaly, no gross cervical lymphadenopathy   CMP ( most recent) CMP     Component Value Date/Time   NA 138 09/10/2023 0823   K 4.4 09/10/2023 0823   CL 103 09/10/2023 0823   CO2 23 09/10/2023 0823   GLUCOSE 87 09/10/2023 0823   GLUCOSE 107 (H) 12/13/2022 0045   BUN 11 09/10/2023 0823   CREATININE 0.97 09/10/2023 0823   CALCIUM 9.5 09/10/2023 0823   PROT 6.7 09/10/2023 0823   ALBUMIN 4.7 09/10/2023 0823   AST 31 09/10/2023 0823   ALT 39 09/10/2023 0823   ALKPHOS 76 09/10/2023 0823   BILITOT 0.9 09/10/2023 0823   EGFR 104 09/10/2023 0823   GFRNONAA >60 12/13/2022 0045     Diabetic Labs (most recent): Lab Results  Component Value Date   HGBA1C 5.9 (H) 10/13/2022     Lipid Panel ( most recent) Lipid Panel     Component Value Date/Time   CHOL 199 10/13/2022 0931   TRIG 157 (H) 10/13/2022 0931   HDL 36 (L) 10/13/2022 0931   CHOLHDL 5.5 (H) 10/13/2022 0931   LDLCALC 135 (H) 10/13/2022 0931   LABVLDL 28 10/13/2022 0931      Lab Results  Component Value  Date   TSH 3.120 09/10/2023   TSH 2.030 05/29/2023   TSH 2.150 10/13/2022   FREET4 0.87 09/10/2023   FREET4 0.93 05/29/2023   FREET4 0.92 10/13/2022        MRI of the brain with and without contrast on May 25, 2023. IMPRESSION: 1. 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 Endocrine function tests are recommended. Follow-up pituitary protocol MRI with and without contrast should be performed in 6-12 months. This follows ACR consensus guidelines: Management of Incidental Pituitary Findings on CT, MRI and F18-FDG PET: A White Paper of the ACR Incidental Findings Committee. J Am Coll Radiol 2018; 15: 966-72. 2. Otherwise unremarkable appearance of the brain. No acute intracranial abnormality.  Assessment & Plan:   1. Pituitary macroadenoma (HCC) (Primary)  2.  Prediabetes   3.  Hyperlipidemia   4.  Obesity   - I have reviewed his new and available endocrine records and clinically evaluated the patient.  - Based on these reviews, he has pituitary macroadenoma.  After evaluation by neurosurgery, decision was to put him on expectant management without operative treatment for now.  He has nonfunctioning macroadenoma with no endocrine deficits. -His prolactin level is normal, no clear utility for dopamine receptor agonists at this time.   -There is some possibility that he might have had this lesion for long-term and was just incidentally discovered recently. He he is advised to keep close follow-up with  with neurosurgery.  If he  is determined to be a nonsurgical patient, he will need follow-up with repeat MRI at least in a year as well as endocrine evaluation with measurement of hormones including: A.m. cortisol, prolactin, thyroid function test, total testosterone  before his next visit in a year.  This patient has metabolic syndrome-complicated by sleep apnea which required CPAP.  I briefly discussed the need for him to change lifestyle to prevent  progression into type 2 diabetes.  He does have severe dyslipidemia.   - he acknowledges that there is a room for improvement in his food and drink choices. - Suggestion is made for him to avoid simple carbohydrates  from his diet including Cakes, Sweet Desserts, Ice Cream, Soda (diet and regular), Sweet Tea, Candies, Chips, Cookies, Store Bought Juices, Alcohol , Artificial Sweeteners,  Coffee Creamer, and "Sugar-free" Products, Lemonade. This will help patient to have more stable blood glucose profile and potentially avoid unintended weight gain.  The following Lifestyle Medicine recommendations according to American College of Lifestyle Medicine  [facility]) were discussed and and offered to patient and he  agrees to start the journey:  A. Whole Foods, Plant-Based Nutrition comprising of fruits and vegetables, plant-based proteins, whole-grain carbohydrates was discussed in detail with the patient.   A list for source of those nutrients were also provided to the patient.  Patient will use only water  or unsweetened tea for hydration. B.  The need to stay away from risky substances including alcohol, smoking; obtaining 7 to 9 hours of restorative sleep, at least 150 minutes of moderate intensity exercise weekly, the importance of healthy social connections,  and stress management techniques were discussed.   - he is advised to maintain close follow up with Marvin Brasil, MD for primary care needs.   I spent  25  minutes in the care of the patient today including review of labs from Thyroid Function, CMP, and other relevant labs ; imaging/biopsy records (current and previous including abstractions from other facilities); face-to-face time discussing  his lab results and symptoms, medications doses, his options of short and long term treatment based on the latest standards of care / guidelines;   and documenting the encounter.  Marvin Rivera  participated in the discussions, expressed understanding, and  voiced agreement with the above plans.  All questions were answered to his satisfaction. he is encouraged to contact clinic should he have any questions or concerns prior to his return visit.   Follow up plan: Return in about 1 year (around 09/13/2024) for Fasting Labs  in AM B4 8, A1c -NV.   Kalvin Orf, MD [facility] [address] [place], [place] [address] Phone: [phone]  Fax: [phone]     09/14/2023, 4:41 PM  This note was partially dictated with voice recognition software. Similar sounding words can be transcribed inadequately or may not  be corrected upon review.

## 2023-10-07 ENCOUNTER — Ambulatory Visit: Payer: Self-pay

## 2023-10-07 ENCOUNTER — Ambulatory Visit: Admitting: Family Medicine

## 2023-10-07 VITALS — BP 150/86 | HR 70 | Temp 98.2°F | Ht 72.0 in | Wt 372.0 lb

## 2023-10-07 DIAGNOSIS — M109 Gout, unspecified: Secondary | ICD-10-CM | POA: Diagnosis not present

## 2023-10-07 MED ORDER — PREDNISONE 20 MG PO TABS: ORAL_TABLET | ORAL | 0 refills | Status: DC

## 2023-10-07 MED ORDER — COLCHICINE 0.6 MG PO TABS: ORAL_TABLET | ORAL | 0 refills | Status: DC

## 2023-10-07 NOTE — Progress Notes (Signed)
   Subjective:    Patient ID: Marvin Rivera, male    DOB: 03/17/1991, 33 y.o.   MRN: [id number]  HPI Significant gout issues right foot pain tenderness hurts with walking present over the past several days has had gout before has had seafood over the weekend with increased activity.  Denies any high fever chills sweats or fevers no sign of cellulitis in the foot   Review of Systems     Objective:   Physical Exam The calf is normal ankle is normal tenderness at the MTP joint second joint.  No sign of cellulitis.       Assessment & Plan:  Probable gout Colchicine  twice daily until relief Prednisone  taper over the next 6 days Lab work in the near future Metabolic 7 and uric acid level ordered More than likely will need to be on allopurinol  Patient to notify us  of any ongoing troubles

## 2023-10-07 NOTE — Telephone Encounter (Signed)
 Appt scheduled

## 2023-10-07 NOTE — Telephone Encounter (Signed)
 FYI Only or Action Required?: FYI only for provider  Patient was last seen in primary care on 08/06/2023 by Derenda Flax, NP. Called Nurse Triage reporting Gout. Symptoms began yesterday. Interventions attempted: OTC medications: tylenol . Symptoms are: unchanged.  Triage Disposition: See HCP Within 4 Hours (Or PCP Triage)  Patient/caregiver understands and will follow disposition?: Yes                Copied from CRM [id number]. Topic: Clinical - Red Word Triage >> Oct 07, 2023  8:32 AM Rosamond Comes wrote: Red Word that prompted transfer to Nurse Triage: patient wife Bernardo Bridgeman calling in, Patient has gout in right great toe, pain has caused patient to miss work, hard to put weight on foot.  Patient has a history of gout. >> Oct 07, 2023  8:35 AM Rosamond Comes wrote: Bernardo Bridgeman phone [phone]  Reason for Disposition  [1] SEVERE pain (e.g., excruciating, unable to do any normal activities) AND [2] not improved after 2 hours of pain medicine  Answer Assessment - Initial Assessment Questions 1. ONSET: When did the pain start?      yesterday 2. LOCATION: Where is the pain located?      Right big toe 3. PAIN: How bad is the pain?    (Scale 1-10; or mild, moderate, severe)  - MILD (1-3): doesn't interfere with normal activities.   - MODERATE (4-7): interferes with normal activities (e.g., work or school) or awakens from sleep, limping.   - SEVERE (8-10): excruciating pain, unable to do any normal activities, unable to walk.      moderate 4. WORK OR EXERCISE: Has there been any recent work or exercise that involved this part of the body?      no 5. CAUSE: What do you think is causing the foot pain?     gout 6. OTHER SYMPTOMS: Do you have any other symptoms? (e.g., leg pain, rash, fever, numbness)     swollen  Protocols used: Foot Pain-A-AH

## 2023-10-07 NOTE — Patient Instructions (Signed)
 Low-Purine Eating Plan A low-purine eating plan involves making food choices to limit your purine intake. Purine is a kind of uric acid. Too much uric acid in your blood can cause certain conditions, such as gout and kidney stones. Eating a low-purine diet may help control these conditions. What are tips for following this plan? Shopping Avoid buying products that contain high-fructose corn syrup. Check for this on food labels. It is commonly found in many processed foods and soft drinks. Be sure to check for it in baked goods such as cookies, canned fruits, and cereals and cereal bars. Avoid buying veal, chicken breast with skin, lamb, and organ meats such as liver. These types of meats tend to have the highest purine content. Choose dairy products. These may lower uric acid levels. Avoid certain types of fish. Not all fish and seafood have high purine content. Examples with high purine content include anchovies, trout, tuna, sardines, and salmon. Avoid buying beverages that contain alcohol, particularly beer and hard liquor. Alcohol can affect the way your body gets rid of uric acid. Meal planning  Learn which foods do or do not affect you. If you find out that a food tends to cause your gout symptoms to flare up, avoid eating that food. You can enjoy foods that do not cause problems. If you have any questions about a food item, talk with your dietitian or health care provider. Reduce the overall amount of meat in your diet. When you do eat meat, choose ones with lower purine content. Include plenty of fruits and vegetables. Although some vegetables may have a high purine content--such as asparagus, mushrooms, spinach, or cauliflower--it has been shown that these do not contribute to uric acid blood levels as much. Consume at least 1 dairy serving a day. This has been shown to decrease uric acid levels. General information If you drink alcohol: Limit how much you have to: 0-1 drink a day for  women who are not pregnant. 0-2 drinks a day for men. Know how much alcohol is in a drink. In the [place] one drink equals one 12 oz bottle of beer (355 mL), one 5 oz glass of wine (148 mL), or one 1 oz glass of hard liquor (44 mL). Drink plenty of water . Try to drink enough to keep your urine pale yellow. Fluids can help remove uric acid from your body. Work with your health care provider and dietitian to develop a plan to achieve or maintain a healthy weight. Losing weight may help reduce uric acid in your blood. What foods are recommended? The following are some types of foods that are good choices when limiting purine intake: Fresh or frozen fruits and vegetables. Whole grains, breads, cereals, and pasta. Rice. Beans, peas, legumes. Nuts and seeds. Dairy products. Fats and oils. The items listed above may not be a complete list. Talk with a dietitian about what dietary choices are best for you. What foods are not recommended? Limit your intake of foods high in purines, including: Beer and other alcohol. Meat-based gravy or sauce. Canned or fresh fish, such as: Anchovies, sardines, herring, salmon, and tuna. Mussels and scallops. Codfish, trout, and haddock. Bacon, veal, chicken breast with skin, and lamb. Organ meats, such as: Liver or kidney. Tripe. Sweetbreads (thymus gland or pancreas). Wild Education officer, environmental. Yeast or yeast extract supplements. Drinks sweetened with high-fructose corn syrup, such as soda. Processed foods made with high-fructose corn syrup. The items listed above may not be a complete list of foods  and beverages you should limit. Contact a dietitian for more information. Summary Eating a low-purine diet may help control conditions caused by too much uric acid in the body, such as gout or kidney stones. Choose low-purine foods, limit alcohol, and limit high-fructose corn syrup. You will learn over time which foods do or do not affect you. If you find out that a  food tends to cause your gout symptoms to flare up, avoid eating that food. This information is not intended to replace advice given to you by your health care provider. Make sure you discuss any questions you have with your health care provider. Document Revised: 03/28/2021 Document Reviewed: 03/28/2021 Elsevier Patient Education  2025 [employer].

## 2023-10-08 ENCOUNTER — Ambulatory Visit: Payer: Self-pay | Admitting: Family Medicine

## 2023-10-08 LAB — URIC ACID: Uric Acid: 7.9 mg/dL (ref 3.8–8.4)

## 2023-10-08 LAB — BASIC METABOLIC PANEL WITH GFR
BUN/Creatinine Ratio: 9 (ref 9–20)
BUN: 9 mg/dL (ref 6–20)
CO2: 22 mmol/L (ref 20–29)
Calcium: 9.9 mg/dL (ref 8.7–10.2)
Chloride: 101 mmol/L (ref 96–106)
Creatinine, Ser: 0.99 mg/dL (ref 0.76–1.27)
Glucose: 81 mg/dL (ref 70–99)
Potassium: 4.7 mmol/L (ref 3.5–5.2)
Sodium: 143 mmol/L (ref 134–144)
eGFR: 102 mL/min/{1.73_m2} (ref >59)

## 2023-10-09 ENCOUNTER — Other Ambulatory Visit: Payer: Self-pay

## 2023-10-09 MED ORDER — COLCHICINE 0.6 MG PO TABS: ORAL_TABLET | ORAL | 0 refills | Status: AC

## 2023-10-09 MED ORDER — ALLOPURINOL 100 MG PO TABS: 100.0000 mg | ORAL_TABLET | Freq: Every day | ORAL | 1 refills | Status: AC

## 2023-10-21 ENCOUNTER — Encounter: Payer: BC Managed Care – PPO | Admitting: Family Medicine

## 2023-10-27 DIAGNOSIS — Z0279 Encounter for issue of other medical certificate: Secondary | ICD-10-CM

## 2023-11-05 ENCOUNTER — Telehealth: Payer: Self-pay | Admitting: Family Medicine

## 2023-11-05 NOTE — Telephone Encounter (Signed)
 [facility] cancellation NP OUT

## 2023-12-09 ENCOUNTER — Ambulatory Visit (INDEPENDENT_AMBULATORY_CARE_PROVIDER_SITE_OTHER): Admitting: Otolaryngology

## 2023-12-09 VITALS — BP 159/77 | HR 78

## 2023-12-09 DIAGNOSIS — J3089 Other allergic rhinitis: Secondary | ICD-10-CM | POA: Diagnosis not present

## 2023-12-09 DIAGNOSIS — D443 Neoplasm of uncertain behavior of pituitary gland: Secondary | ICD-10-CM

## 2023-12-09 DIAGNOSIS — R0981 Nasal congestion: Secondary | ICD-10-CM | POA: Diagnosis not present

## 2023-12-09 DIAGNOSIS — G4733 Obstructive sleep apnea (adult) (pediatric): Secondary | ICD-10-CM

## 2023-12-09 DIAGNOSIS — J3489 Other specified disorders of nose and nasal sinuses: Secondary | ICD-10-CM | POA: Diagnosis not present

## 2023-12-09 DIAGNOSIS — R0982 Postnasal drip: Secondary | ICD-10-CM

## 2023-12-09 DIAGNOSIS — J343 Hypertrophy of nasal turbinates: Secondary | ICD-10-CM

## 2023-12-09 DIAGNOSIS — J342 Deviated nasal septum: Secondary | ICD-10-CM
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
## 2023-12-11 ENCOUNTER — Telehealth: Payer: Self-pay

## 2023-12-11 NOTE — Telephone Encounter (Signed)
 Patient dropped off document Insurance forms , to be filled out by provider. Patient requested to send it back via Call Patient to pick up within ASAP. Document is located in providers tray at front office.Please advise at Mobile [phone] (mobile)

## 2023-12-15 ENCOUNTER — Encounter (INDEPENDENT_AMBULATORY_CARE_PROVIDER_SITE_OTHER): Payer: Self-pay

## 2023-12-15 NOTE — Telephone Encounter (Signed)
 Form was completed Please fill in the administrative portion Forwarded to the patient Scan a copy into the system Also may have a copy of MRI from 05/25/2023 to go with this form thank you

## 2024-01-05 DIAGNOSIS — G4733 Obstructive sleep apnea (adult) (pediatric): Secondary | ICD-10-CM | POA: Insufficient documentation

## 2024-01-11 NOTE — Progress Notes (Unsigned)
 PATIENT: Marvin Rivera DOB: [birth year]  REASON FOR VISIT: follow up HISTORY FROM: patient  No chief complaint on file.    HISTORY OF PRESENT ILLNESS:  01/11/24 ALL:  Juliane returns for follow up for OSA on CPAP.     01/08/2023 ALL:  Marvin Rivera is a 33 y.o. male here today for follow up for OSA on CPAP. He was last seen by Dr Buck for follow up 06/2021 and doing well. Since, he continues to do well. He loves his machine. He is using it nightly for about 7-8 hours, on average. No concerns with machine or supplies, however, he does tell me that he has been ordering supplies online. He doesn't think his previous insurance payer would cover but now insured through [employer]. He is sleeping well. Wakes feeling refreshed. BP is usually 120's/70's. He feels it is unusually elevated today due to having to carry a heavy air unit up a flight of steps just prior to having to get to his visit. He will continue to monitor at home.     HISTORY: (copied from Dr Obie previous note)  Marvin Rivera is a 33 year old right-handed gentleman with an underlying medical history of cholecystitis, chest pain, hypertension, liver cyst, and severe obesity with a BMI of over 45, who Presents for follow-up consultation of his obstructive sleep apnea after interim home sleep testing and starting AutoPap therapy.  The patient is unaccompanied today.  I first met him at the request of his primary care physician on 12/12/2020, at which time he reported snoring, witnessed apneas as well as excessive daytime somnolence.  He was advised to proceed with a sleep study.  He had a home sleep test on 02/04/2021 which indicated moderate obstructive sleep apnea with a total AHI of 15.2/hour and O2 nadir of 85%.  Snoring was detected in the mild-to-moderate range, at times louder.  He was advised to start AutoPap therapy.  His set up date was 04/05/2021.  He has a ResMed air sense 10 machine.   Today, 07/03/2021: I reviewed his  AutoPap compliance data from 06/02/2021 through 07/01/2021, which is a total of 30 days, during which time he used his machine every night with percent use days greater than 4 hours at 87%, indicating very good compliance with an average usage of 6 hours and 19 minutes, residual AHI at goal at 0.6/h, average pressure for the 95th percentile at 11.1 cm with a range of 6 to 18 cm, leak on the higher side with the 95th percentile at 29.1 L/min.  He reports feeling better.  He has better daytime energy and less tiredness.  He has had some weight gain, probably in part due to stress, he believes. His wife is noted that he no longer snores but he has had some leak from the mask.  He uses a fullface mask, of note, he also has a full beard.   Of note, his DME company is [employer].  I had requested his AutoPap range to be 6 to 12 cm, not sure why they placed him on maximum pressure of 18 cm, maximum pressure for this past month was 12.3 cm.  He reports that sleep is sometimes interrupted because his 87-year-old does not sleep well.  He has a 35 year old and a 67-year-old as well.  His first machine was defective and he got a new machine in January 2023.  He has plenty of supplies and knows to change the filter monthly.   He had interim emergency  room visits in November 2022 for acute gout and dental abscess, and an emergency room visit in January 2023 for abdominal pain.  He was found to have epiploic appendagitis.    REVIEW OF SYSTEMS: Out of a complete 14 system review of symptoms, the patient complains only of the following symptoms, none and all other reviewed systems are negative.  ESS: 9/24  ALLERGIES: Allergies  Allergen Reactions   Bee Venom     yellowjackets   Compazine [Prochlorperazine] Anxiety    IV dose caused panicky feeling and agitation when treated at [facility] 05/19/23    HOME MEDICATIONS: Outpatient Medications Prior to Visit  Medication Sig Dispense Refill   allopurinol  (ZYLOPRIM )  100 MG tablet Take 1 tablet (100 mg total) by mouth daily. 90 tablet 1   colchicine  0.6 MG tablet 1 bid till relief for gout (Patient not taking: Reported on 12/09/2023) 30 tablet 0   MELATONIN GUMMIES PO Take by mouth as needed. (Patient not taking: Reported on 12/09/2023)     predniSONE  (DELTASONE ) 20 MG tablet 3 every day for 2d then 2qd for 2d then 1qd for 2d (Patient not taking: Reported on 12/09/2023) 12 tablet 0   No facility-administered medications prior to visit.    PAST MEDICAL HISTORY: Past Medical History:  Diagnosis Date   Gallstones    Hypertension    Liver cyst 2009   Sleep apnea     PAST SURGICAL HISTORY: Past Surgical History:  Procedure Laterality Date   BIOPSY  01/21/2023   Procedure: BIOPSY;  Surgeon: Cinderella Deatrice FALCON, MD;  [facility];  Service: Endoscopy;;   CHOLECYSTECTOMY     COLONOSCOPY WITH PROPOFOL  N/A 01/21/2023   Procedure: COLONOSCOPY WITH PROPOFOL ;  Surgeon: Cinderella Deatrice FALCON, MD;  [facility];  Service: Endoscopy;  Laterality: N/A;  10:30am;asa 2   POLYPECTOMY  01/21/2023   Procedure: POLYPECTOMY;  Surgeon: Cinderella Deatrice FALCON, MD;  [facility];  Service: Endoscopy;;    FAMILY HISTORY: Family History  Problem Relation Age of Onset   Lung cancer Mother 38   Atrial fibrillation Mother    Hypertension Mother    Atrial fibrillation Father    Sleep apnea Brother    Other Paternal Aunt        tumor (unspecified location)   Other Paternal Aunt        cardiac tumor (likely myxoma), carney complex   Other Paternal Uncle        cardiac tumor (likely myxoma)   Heart disease Paternal Grandmother        cardiac tumor (likely myxoma)   Other Paternal Grandmother        carney complex   Lung cancer Paternal Grandfather    Other Cousin        pituitary adenoma, carney complex, paternal first couin once removed    SOCIAL HISTORY: Social History   Socioeconomic History   Marital status: Married    Spouse name: Not on  file   Number of children: 3   Years of education: Not on file   Highest education level: Not on file  Occupational History   Not on file  Tobacco Use   Smoking status: Never    Passive exposure: Current   Smokeless tobacco: Never  Vaping Use   Vaping status: Never Used  Substance and Sexual Activity   Alcohol use: Not Currently    Comment: rarely   Drug use: No   Sexual activity: Yes  Other Topics  Concern   Not on file  Social History Narrative   Not on file   Social Drivers of Health   Financial Resource Strain: Low Risk  (05/19/2023)   Received from [facility]   Overall Financial Resource Strain (CARDIA)    Difficulty of Paying Living Expenses: Not hard at all  Food Insecurity: No Food Insecurity (05/19/2023)   Received from [facility]   Hunger Vital Sign    Within the past 12 months, you worried that your food would run out before you got the money to buy more.: Never true    Within the past 12 months, the food you bought just didn't last and you didn't have money to get more.: Never true  Transportation Needs: No Transportation Needs (05/19/2023)   Received from [facility] - Transportation    Lack of Transportation (Medical): No    Lack of Transportation (Non-Medical): No  Physical Activity: Inactive (05/19/2023)   Received from [facility]   Exercise Vital Sign    On average, how many days per week do you engage in moderate to strenuous exercise (like a brisk walk)?: 0 days    On average, how many minutes do you engage in exercise at this level?: 0 min  Stress: No Stress Concern Present (05/19/2023)   Received from [facility] - Occupational Stress Questionnaire    Feeling of Stress : Only a little  Social Connections: Socially Integrated (05/19/2023)   Received from [facility]   Social Connection and Isolation Panel    In a typical week, how many times do you talk on the phone with  family, friends, or neighbors?: More than three times a week    How often do you get together with friends or relatives?: More than three times a week    How often do you attend church or religious services?: More than 4 times per year    Do you belong to any clubs or organizations such as church groups, unions, fraternal or athletic groups, or school groups?: Yes    How often do you attend meetings of the clubs or organizations you belong to?: More than 4 times per year    Are you married, widowed, divorced, separated, never married, or living with a partner?: Married  Intimate Partner Violence: Not At Risk (05/19/2023)   Received from [facility]   Humiliation, Afraid, Rape, and Kick questionnaire    Within the last year, have you been afraid of your partner or ex-partner?: No    Within the last year, have you been humiliated or emotionally abused in other ways by your partner or ex-partner?: No    Within the last year, have you been kicked, hit, slapped, or otherwise physically hurt by your partner or ex-partner?: No    Within the last year, have you been raped or forced to have any kind of sexual activity by your partner or ex-partner?: No     PHYSICAL EXAM  There were no vitals filed for this visit.  There is no height or weight on file to calculate BMI.  Generalized: Well developed, in no acute distress  Cardiology: normal rate and rhythm, no murmur noted Respiratory: clear to auscultation bilaterally  Neurological examination  Mentation: Alert oriented to time, place, history taking. Follows all commands speech and language fluent Cranial nerve II-XII: Pupils were equal round reactive to light. Extraocular movements were full, visual field  were full  Motor: The motor testing reveals 5 over 5 strength of all 4 extremities. Good symmetric motor tone is noted throughout.  Gait and station: Gait is normal.    DIAGNOSTIC DATA (LABS, IMAGING, TESTING) - I reviewed patient  records, labs, notes, testing and imaging myself where available.      No data to display           Lab Results  Component Value Date   WBC 13.5 (H) 12/13/2022   HGB 13.8 12/13/2022   HCT 40.6 12/13/2022   MCV 88.5 12/13/2022   PLT 381 12/13/2022      Component Value Date/Time   NA 143 10/07/2023 1136   K 4.7 10/07/2023 1136   CL 101 10/07/2023 1136   CO2 22 10/07/2023 1136   GLUCOSE 81 10/07/2023 1136   GLUCOSE 107 (H) 12/13/2022 0045   BUN 9 10/07/2023 1136   CREATININE 0.99 10/07/2023 1136   CALCIUM 9.9 10/07/2023 1136   PROT 6.7 09/10/2023 0823   ALBUMIN 4.7 09/10/2023 0823   AST 31 09/10/2023 0823   ALT 39 09/10/2023 0823   ALKPHOS 76 09/10/2023 0823   BILITOT 0.9 09/10/2023 0823   GFRNONAA >60 12/13/2022 0045   GFRAA 113 10/01/2017 1537   Lab Results  Component Value Date   CHOL 199 10/13/2022   HDL 36 (L) 10/13/2022   LDLCALC 135 (H) 10/13/2022   TRIG 157 (H) 10/13/2022   CHOLHDL 5.5 (H) 10/13/2022   Lab Results  Component Value Date   HGBA1C 5.9 (H) 10/13/2022   No results found for: [id number] Lab Results  Component Value Date   TSH 3.120 09/10/2023     ASSESSMENT AND PLAN 33 y.o. year old male  has a past medical history of Gallstones, Hypertension, Liver cyst (2009), and Sleep apnea. here with   No diagnosis found.    Marvin Rivera is doing well on CPAP therapy. Compliance report reveals excellent compliance. He was encouraged to continue using CPAP nightly and for greater than 4 hours each night. We will update supply orders as indicated. Risks of untreated sleep apnea review and education materials provided. He was encouraged to monitor BP at home. Vital sign review in Epic show it is usually 130's/70's. Continue close follow up with Dr Alphonsa. Healthy lifestyle habits encouraged. He will follow up in 1 year, sooner if needed. He verbalizes understanding and agreement with this plan.    No orders of the defined types were placed in  this encounter.    No orders of the defined types were placed in this encounter.     Greig Forbes, FNP-C 01/11/2024, 1:12 PM [facility] [address], [address] [place], [place] [address] [phone]

## 2024-01-11 NOTE — Patient Instructions (Signed)

## 2024-01-11 NOTE — Telephone Encounter (Signed)
 Pt has been r/s for tomorrow with NP.

## 2024-01-11 NOTE — Telephone Encounter (Signed)
 Noted

## 2024-01-11 NOTE — Progress Notes (Unsigned)
 Marvin Rivera

## 2024-01-12 ENCOUNTER — Ambulatory Visit: Admitting: Family Medicine

## 2024-01-12 ENCOUNTER — Encounter: Payer: Self-pay | Admitting: Family Medicine

## 2024-01-12 VITALS — BP 150/82 | HR 72 | Ht 72.0 in | Wt 380.0 lb

## 2024-01-12 DIAGNOSIS — G4733 Obstructive sleep apnea (adult) (pediatric): Secondary | ICD-10-CM | POA: Diagnosis not present

## 2024-01-14 ENCOUNTER — Ambulatory Visit: Payer: BC Managed Care – PPO | Admitting: Family Medicine

## 2024-02-04 ENCOUNTER — Emergency Department (HOSPITAL_COMMUNITY)

## 2024-02-04 ENCOUNTER — Other Ambulatory Visit: Payer: Self-pay

## 2024-02-04 ENCOUNTER — Emergency Department (HOSPITAL_COMMUNITY)
Admission: EM | Admit: 2024-02-04 | Discharge: 2024-02-05 | Disposition: A | Discharge status: Home / Self Care | Attending: Emergency Medicine | Admitting: Emergency Medicine

## 2024-02-04 DIAGNOSIS — D72829 Elevated white blood cell count, unspecified: Secondary | ICD-10-CM | POA: Insufficient documentation

## 2024-02-04 DIAGNOSIS — J069 Acute upper respiratory infection, unspecified: Secondary | ICD-10-CM | POA: Insufficient documentation

## 2024-02-04 DIAGNOSIS — R059 Cough, unspecified: Secondary | ICD-10-CM | POA: Diagnosis present

## 2024-02-04 LAB — CBC WITH DIFFERENTIAL/PLATELET
Abs Immature Granulocytes: 0.05 K/uL (ref 0.00–0.07)
Basophils Absolute: 0.1 K/uL (ref 0.0–0.1)
Basophils Relative: 1 %
Eosinophils Absolute: 0.1 K/uL (ref 0.0–0.5)
Eosinophils Relative: 1 %
HCT: 46 % (ref 39.0–52.0)
Hemoglobin: 15.2 g/dL (ref 13.0–17.0)
Immature Granulocytes: 1 %
Lymphocytes Relative: 20 %
Lymphs Abs: 2.1 K/uL (ref 0.7–4.0)
MCH: 29.6 pg (ref 26.0–34.0)
MCHC: 33 g/dL (ref 30.0–36.0)
MCV: 89.7 fL (ref 80.0–100.0)
Monocytes Absolute: 1.3 K/uL — ABNORMAL HIGH (ref 0.1–1.0)
Monocytes Relative: 12 %
Neutro Abs: 7.2 K/uL (ref 1.7–7.7)
Neutrophils Relative %: 65 %
Platelets: 341 K/uL (ref 150–400)
RBC: 5.13 MIL/uL (ref 4.22–5.81)
RDW: 13 % (ref 11.5–15.5)
WBC: 10.7 K/uL — ABNORMAL HIGH (ref 4.0–10.5)
nRBC: 0 % (ref 0.0–0.2)

## 2024-02-04 LAB — COMPREHENSIVE METABOLIC PANEL WITH GFR
ALT: 46 U/L — ABNORMAL HIGH (ref 0–44)
AST: 35 U/L (ref 15–41)
Albumin: 4.4 g/dL (ref 3.5–5.0)
Alkaline Phosphatase: 65 U/L (ref 38–126)
Anion gap: 13 (ref 5–15)
BUN: 9 mg/dL (ref 6–20)
CO2: 23 mmol/L (ref 22–32)
Calcium: 9.1 mg/dL (ref 8.9–10.3)
Chloride: 100 mmol/L (ref 98–111)
Creatinine, Ser: 1.01 mg/dL (ref 0.61–1.24)
GFR, Estimated: >60 mL/min (ref >60)
Glucose, Bld: 97 mg/dL (ref 70–99)
Potassium: 3.7 mmol/L (ref 3.5–5.1)
Sodium: 136 mmol/L (ref 135–145)
Total Bilirubin: 1.8 mg/dL — ABNORMAL HIGH (ref 0.0–1.2)
Total Protein: 7.4 g/dL (ref 6.5–8.1)

## 2024-02-04 LAB — RESP PANEL BY RT-PCR (RSV, FLU A&B, COVID)  RVPGX2
Influenza A by PCR: NEGATIVE
Influenza B by PCR: NEGATIVE
Resp Syncytial Virus by PCR: NEGATIVE
SARS Coronavirus 2 by RT PCR: NEGATIVE

## 2024-02-04 LAB — URINALYSIS, ROUTINE W REFLEX MICROSCOPIC
Bilirubin Urine: NEGATIVE
Glucose, UA: NEGATIVE mg/dL
Hgb urine dipstick: NEGATIVE
Ketones, ur: 5 mg/dL — AB
Leukocytes,Ua: NEGATIVE
Nitrite: NEGATIVE
Protein, ur: NEGATIVE mg/dL
Specific Gravity, Urine: 1.021 (ref 1.005–1.030)
pH: 5 (ref 5.0–8.0)

## 2024-02-04 LAB — GROUP A STREP BY PCR: Group A Strep by PCR: NOT DETECTED

## 2024-02-04 NOTE — ED Triage Notes (Signed)
 Patient reports sore throat , productive cough and fever for several days , currently taking oral antibiotic for tooth abscess/wisdom tooth extraction last Tuesday .

## 2024-02-04 NOTE — Telephone Encounter (Signed)
 Upcoming Appt: No future appointments.  Disposition: Go to ED Now  Encounter Reason for Disposition: .  [1] Face swelling AND [2] fever Right side base tongue swelling .  Unable to open mouth completely See other GL had dental extraction   Is this a pediatric patient?  No   Any recent, relevant visit?  Yes   Date/location of recent visit? 02/02/24 oral surgery  Any related medications? Yes   Name of medication and dose? Penicillin  500 mg QID- started to take amox 2 weeks prior to surgery and penicillin  post surgery   Any relevant medical history?  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  Yes   What has been done? (include related medication given, dose, and date/time) Hydrocodone  5- 325 02/04/24  noon Ibuprofen 400 02/04/24 at  615 pm    [facility] Eligibility  Patient is eligible for [facility] (according to most recent zip code and insurance information)?  No  Patient is [facility] eligible and appropriate for referral: No   Encounter Initial Assessment: 1. SYMPTOM: What's the main symptom you're concerned about? (e.g., bleeding, pain, fever)     Tooth surgery on anbx penicillin      Lower right wisdom and tooth beside it removed Surgery 02/02/24 Low grade temp and called the oral surgeon and spoke to them about temp 100 and pain and swelling Temp is now 100.7 orally- the oral surgeon was dismissive and she did not call back when temp went up to this number Pain top right jaw, and whole right side facial pain from eye to throat and told to call Dental back in the am 2. ONSET: When did the temp  start?     6pm 02/04/24  3. DATE of TOOTH EXTRACTION: When was the surgery performed?      02/02/24 4. BLEEDING: Is there any bleeding? If Yes, ask: How much?      no 5. PAIN: Is there any pain? If Yes, ask: How bad is it?  (Scale 1-10; or mild, moderate, severe)     See above on pain, no worse 6/10  pain 6. FEVER: Do you have a fever? If Yes, ask: What is your temperature, how was it measured, and when did it start?     100.7 see above 7. OTHER SYMPTOMS: Do you have any other symptoms? (e.g., face swelling) Some swelling to the right face, sore throat and green mucus with slight cough Same type of throat pain he started to feel when the abscess was starting before the tooth was removed he states See other GL   1. ONSET: When did the throat start hurting? (Hours or days ago)      02/02/24 woke up from the surgery and numbing wore off he had a sore throat and had a sore throat prior to surgery and the abscess was dx and hurting worse 2. SEVERITY: How bad is the sore throat? (Scale 1-10; mild, moderate or severe)     6/10 3. STREP EXPOSURE: Has there been any exposure to strep within the past week? If Yes, ask: What type of contact occurred?      no 4.  VIRAL SYMPTOMS: Are there any symptoms of a cold, such as a runny nose, cough, hoarse voice or red eyes?      Green congestion he coughs up now and then, not a solid cough and fever and see other GL for dental extraction 5. FEVER: Do you have a fever? If  Yes, ask: What is your temperature, how was it measured, and when did it start?     100.7 orally now at time of call 6. PUS ON THE TONSILS: Is there pus on the tonsils in the back of your throat?     No 7. OTHER SYMPTOMS: Do you have any other symptoms? (e.g., difficulty breathing, headache, rash)     02/03/24 coughing here and there with a tickle and then coughs  up green mucus related to congestion Throat pain started when numbing medicine wore off, forceful expiration raspy and crackles wife added in, not now on the call she states , sounds hoarse  8. PREGNANCY: Is there any chance you are pregnant? When was your last menstrual period?     NA   Encounter Protocols Used: SABRA  Tooth Extraction-A-AH .  Sore Throat-A-AH

## 2024-02-05 MED ORDER — ALBUTEROL SULFATE HFA 108 (90 BASE) MCG/ACT IN AERS
1.0000 | INHALATION_SPRAY | Freq: Once | RESPIRATORY_TRACT | Status: AC
  Administered 2024-02-05: 1 via RESPIRATORY_TRACT
  Filled 2024-02-05: qty 6.7

## 2024-02-05 MED ORDER — CLINDAMYCIN HCL 150 MG PO CAPS: 450.0000 mg | ORAL_CAPSULE | Freq: Three times a day (TID) | ORAL | 0 refills | Status: AC

## 2024-02-05 MED ORDER — OXYCODONE-ACETAMINOPHEN 5-325 MG PO TABS
1.0000 | ORAL_TABLET | Freq: Once | ORAL | Status: AC
  Administered 2024-02-05: 1 via ORAL
  Filled 2024-02-05: qty 1

## 2024-02-05 NOTE — ED Provider Notes (Signed)
 [facility] Provider Note   CSN: [id number] Arrival date & time: 02/04/24  2120     Patient presents with: Cough , Fever , Sore Throat  (S/P Wisdom Tooth extraction last Tuesday)   Marvin Rivera is a 33 y.o. male.   The history is provided by the patient and the spouse.  Marvin Rivera is a 33 y.o. male who presents to the Emergency Department complaining of sore throat and cough. He presents the emergency department for evaluation of symptoms that started several days ago. On Tuesday he had two molars removed. He had been on amoxicillin  prior to that removal and has been on penicillin  since then. He reports associated right sided sore throat, persistent cough productive of yellow sputum. No throat swelling, difficulty breathing. He did have a temperature to 100.6 tonight. No significant medical history.     Prior to Admission medications   Medication Sig Start Date End Date Taking? Authorizing Provider  allopurinol  (ZYLOPRIM ) 100 MG tablet Take 1 tablet (100 mg total) by mouth daily. 10/09/23   Alphonsa Glendia LABOR, MD  colchicine  0.6 MG tablet 1 bid till relief for gout Patient not taking: Reported on 01/12/2024 10/09/23   Alphonsa Glendia LABOR, MD  MELATONIN GUMMIES PO Take by mouth as needed.    [provider]  predniSONE  (DELTASONE ) 20 MG tablet 3 every day for 2d then 2qd for 2d then 1qd for 2d Patient not taking: Reported on 01/12/2024 10/07/23   Alphonsa Glendia LABOR, MD    Allergies: Bee venom and Compazine [prochlorperazine]    Review of Systems  All other systems reviewed and are negative.   Updated Vital Signs BP (!) 163/105 (BP Location: Right Arm)   Pulse 77   Temp 99.8 F (37.7 C)   Resp 20   SpO2 97%   Physical Exam Vitals and nursing note reviewed.  Constitutional:      Appearance: He is well-developed.  HENT:     Head: Normocephalic and atraumatic.     Comments: There is no significant soft tissue swelling in the  posterior oropharynx. There is a healing wound bed to the right posterior mandible with mild reactive local swelling.  Cardiovascular:     Rate and Rhythm: Normal rate and regular rhythm.     Heart sounds: No murmur heard. Pulmonary:     Effort: Pulmonary effort is normal. No respiratory distress.     Breath sounds: Normal breath sounds.  Abdominal:     Palpations: Abdomen is soft.     Tenderness: There is no abdominal tenderness. There is no guarding or rebound.  Musculoskeletal:        General: No tenderness.     Cervical back: Neck supple.  Skin:    General: Skin is warm and dry.  Neurological:     Mental Status: He is alert and oriented to person, place, and time.  Psychiatric:        Behavior: Behavior normal.     (all labs ordered are listed, but only abnormal results are displayed) Labs Reviewed  CBC WITH DIFFERENTIAL/PLATELET - Abnormal; Notable for the following components:      Result Value   WBC 10.7 (*)    Monocytes Absolute 1.3 (*)    All other components within normal limits  COMPREHENSIVE METABOLIC PANEL WITH GFR - Abnormal; Notable for the following components:   ALT 46 (*)    Total Bilirubin 1.8 (*)    All other components within  normal limits  URINALYSIS, ROUTINE W REFLEX MICROSCOPIC - Abnormal; Notable for the following components:   Ketones, ur 5 (*)    All other components within normal limits  RESP PANEL BY RT-PCR (RSV, FLU A&B, COVID)  RVPGX2  GROUP A STREP BY PCR    EKG: None  Radiology: DG Chest 2 View Result Date: 02/04/2024 EXAM: 2 VIEW(S) XRAY OF THE CHEST 02/04/2024 10:37:00 PM COMPARISON: None available. CLINICAL HISTORY: cough / fever. Patient reports sore throat, productive cough and fever for several days, currently taking oral antibiotic for tooth abscess/wisdom tooth extraction last Tuesday. FINDINGS: LUNGS AND PLEURA: No focal pulmonary opacity. No pulmonary edema. No pleural effusion. No pneumothorax. HEART AND MEDIASTINUM: No acute  abnormality of the cardiac and mediastinal silhouettes. BONES AND SOFT TISSUES: No acute osseous abnormality. IMPRESSION: 1. No acute process. Electronically signed by: Dorethia Molt MD 02/04/2024 10:41 PM EDT RP Workstation: [id number]     Procedures   Medications Ordered in the ED  albuterol (VENTOLIN HFA) 108 (90 Base) MCG/ACT inhaler 1 puff (has no administration in time range)  oxyCODONE -acetaminophen  (PERCOCET/ROXICET) 5-325 MG per tablet 1 tablet (1 tablet Oral Given 02/05/24 0300)                                    Medical Decision Making Amount and/or Complexity of Data Reviewed Labs: ordered. Radiology: ordered.  Risk Prescription drug management.   Patient here for evaluation of fever, cough and sore throat, did have a recent tooth extraction on Tuesday. He has a healing wound bed. No evidence of RPA, PTA, epiglotitis. Chest x-ray is negative for acute abnormality. Images were personally reviewed and interpreted, agree with radiologist interpretation. He did have intermittent wheeze on examination, will provide albuterol that he may use as needed. He is negative for COVID, flu and RSV, strep. CBC with minimal leukocytosis. Suspect he has a viral upper respiratory infection. Will provide a prescription for antibiotics that he may take if he has any progression of his swelling at his surgery site or sensation of swelling in his throat. Discussed return precautions for progressive or concerning symptoms.     Final diagnoses:  Viral URI with cough    ED Discharge Orders     None          Griselda Norris, MD 02/05/24 [phone]

## 2024-02-08 ENCOUNTER — Other Ambulatory Visit: Payer: Self-pay | Admitting: Medical Genetics

## 2024-02-08 DIAGNOSIS — Z006 Encounter for examination for normal comparison and control in clinical research program: Secondary | ICD-10-CM

## 2024-02-12 ENCOUNTER — Ambulatory Visit (INDEPENDENT_AMBULATORY_CARE_PROVIDER_SITE_OTHER): Admitting: Family Medicine

## 2024-02-12 VITALS — BP 142/91 | HR 63 | Temp 98.1°F | Ht 72.0 in | Wt 367.8 lb

## 2024-02-12 DIAGNOSIS — E785 Hyperlipidemia, unspecified: Secondary | ICD-10-CM

## 2024-02-12 DIAGNOSIS — R739 Hyperglycemia, unspecified: Secondary | ICD-10-CM

## 2024-02-12 DIAGNOSIS — Z0000 Encounter for general adult medical examination without abnormal findings: Secondary | ICD-10-CM

## 2024-02-12 NOTE — Patient Instructions (Signed)
 Hi Marvin Rivera-here is some standard information that we desire our patients to be aware of before starting any GLP-1-Wegovy   If you do decide to start the medication please send us  a MyChart message  Do your labs at your convenience somewhere in the next 30 days  Please follow-up in 4 months to recheck your blood pressure  Healthy diet regular physical activity and walking is best approach currently   Take care-Dr. Glendia   discussion of GLP-1 medications Please remember these medications are to assist with weight reduction and diabetes control.  They do not take the place of healthy eating and regular physical activity. There are several GLP-1 medications that can help with weight reduction and diabetes control.  GLP-1 medications with indication for diabetes-Trulicity, Victoza, Bydureon , Mounjaro, Ozempic, and Rybelsus (although these are injected medicines except for Rybelsus which is a pill) GLP-1 medications with indications for weight loss-Wegovy, and Saxenda  Mechanism of action  These medications stimulate glucagon peptide receptors.  By doing so it does the following:  1-slows down stomach emptying-essentially food takes longer to go through the stomach and the intestines-this can lessen appetite  2-reduces glucagon secretion from the pancreas-this helps keep blood glucose levels stable between meals  3-increase his insulin  release from the pancreas-with diabetics this helps keep blood glucose levels stable  4-promotes the feeling of being full in the brain-encouraged him receptors in the brain received the signal so the brain and body know that it is time to stop eating Benefits of the medication  1-reduced weight-reduction of weight is more significant at higher dosing.  Not as much weight reduction with Rybelsus   A- typically Wegovy at higher doses can assist with significant weight reduction.  2-improved blood glucose levels-with diabetics typically we see a significant drop with  A1c when the medication is adjusted appropriately.  3-decrease risk of heart attacks and strokes-individuals with type 2 diabetes are at increased risk of heart attacks and strokes.  These GLP-1 medications can reduce the risk by 22%.  Risk of GLP-1 medications-these are some of the risks.  It is important to talk with your provider in a shared discussion before starting any medication.  Contraindications to GLP-1 medications-in other words who should not take these.  1-individuals with history of thyroid medullary cancer  2-individuals with family history of multiple endocrine neoplasm syndrome type II (MEN 2)  3-individuals with a history of pancreatitis  4-those with a history of severe hypersensitivity or allergy reactions to GLP-1 medications  5-should be avoided in individuals with a history of suicidal attempts or active suicidal ideation.  Cautions include- 1- risk of thyroid C-cell tumors were seen in rats during clinical testing in humans the relevancy of this information has not been determined 2-risk of pancreatitis including fatal and nonfatal hemorrhagic or necrotizing pancreatitis 3-gallbladder disease slightly increased risk of gallstones 4-hypoglycemia-more common with individuals who are on insulin  or sulfonylurea such as glipizide 5-acute kidney injury or worsening of chronic renal failure often this is triggered by severe vomiting and diarrhea as side effects to GLP-1. 6-slightly increased risk of diabetic retinopathy complications in patients with type 2 diabetes 7-heart rate increase-slight increase of base heart rate with GLP-1 8-suicidal behavior and ideation has been reported in clinical trials with other weight loss medicines.  If depression occurs with medication or suicidal ideation stop medication immediately notify provider or seek help.  Common side effects include nausea, vomiting, diarrhea, constipation and increased heart rate.  Less common side effects severe  abdominal pain-if this occurs notify provider stop medication get tested for pancreatitis. Typically weight loss with GLP-1 medicines can be anywhere between 10% weight loss all the way up to closer to 20% weight loss.  Greater weight loss occurs with the higher dosing.  Increase potential for side effects also increase with higher dosing including nausea.  Typically the weight loss occurs gradually over 1 years time.  In individuals who are no longer able to take the medication or choose to stop the medication typically regain at least 90% of the weight that they have lost over the next 6 months.   In other words this medication should not be utilized as a as needed weight loss medication.  In other words something a person uses for 5 to 6 months to help him/her lose weight and then stop the medication and maintain the loss by healthy eating alone.  This study showed that people regain your weight when they stop the medicine.  Currently the scientific view of this medication is becomes a lifelong medication.  That can be a big challenge for many people.  And for many individuals a economic challenge. Full discussion with your provider should occur before starting these medicines.  Should you desire additional detailed discussion we would recommend an office visit to sit down and discuss further.

## 2024-02-12 NOTE — Progress Notes (Unsigned)
   Subjective:    Patient ID: Marvin Rivera, male    DOB: January 27, 1991, 33 y.o.   MRN: [id number]  HPI Pt. Is in room 6.  Pt. Is here for a physical and did not address any concerns. The patient comes in today for a wellness visit.    A review of their health history was completed.  A review of medications was also completed.  Any needed refills; none  Eating habits: States he does eat more than what he should he is trying to be more mindful  Falls/  MVA accidents in past few months: Denies any falls or injury  Regular exercise: Able to fit in some walking at work  Specialist pt sees on regular basis: None  Preventative health issues were discussed.   Additional concerns: None    Review of Systems     Objective:   Physical Exam  General-in no acute distress Eyes-no discharge Lungs-respiratory rate normal, CTA CV-no murmurs,RRR Extremities skin warm dry no edema Neuro grossly normal Behavior normal, alert       Assessment & Plan:  1. Well adult exam (Primary) Adult wellness-complete.wellness physical was conducted today. Importance of diet and exercise were discussed in detail.  Importance of stress reduction and healthy living were discussed.  In addition to this a discussion regarding safety was also covered.  We also reviewed over immunizations and gave recommendations regarding current immunization needed for age.  The time was going yeah yeah I was  In addition to this additional areas were also touched on including: Preventative health exams needed:  Colonoscopy not indicated currently no family history of colon cancer or prostate cancer  Patient was advised yearly wellness exam   2. Hyperlipidemia, unspecified hyperlipidemia type Check lab work healthy diet - Lipid panel  3. Hyperglycemia Check lab work healthy diet regular activity - Hemoglobin A1c  4. Morbid obesity (HCC) Portion control regular activity Did discuss weight loss Patient  states he has tried cutting back on calories in the past States is difficult because he finds himself wanting to eat a lot Also finds his energy level not as good as it should be Denies sleep apnea symptoms We did discuss Wegovy we discussed side effects and how it works He states he will think about it and let us  know

## 2024-02-13 ENCOUNTER — Encounter: Payer: Self-pay | Admitting: Family Medicine

## 2024-02-15 ENCOUNTER — Other Ambulatory Visit (HOSPITAL_COMMUNITY): Payer: Self-pay | Admitting: Neurosurgery

## 2024-02-15 DIAGNOSIS — D352 Benign neoplasm of pituitary gland: Secondary | ICD-10-CM

## 2024-02-20 ENCOUNTER — Ambulatory Visit (HOSPITAL_COMMUNITY)
Admission: RE | Admit: 2024-02-20 | Discharge: 2024-02-20 | Disposition: A | Discharge status: Home / Self Care | Source: Ambulatory Visit | Attending: Neurosurgery | Admitting: Neurosurgery

## 2024-02-20 DIAGNOSIS — D352 Benign neoplasm of pituitary gland: Secondary | ICD-10-CM | POA: Insufficient documentation

## 2024-02-20 MED ORDER — GADOBUTROL 1 MMOL/ML IV SOLN
10.0000 mL | Freq: Once | INTRAVENOUS | Status: AC | PRN
  Administered 2024-02-20: 10 mL via INTRAVENOUS

## 2024-02-25 ENCOUNTER — Ambulatory Visit: Payer: Self-pay | Admitting: Family Medicine

## 2024-02-25 LAB — LIPID PANEL
Chol/HDL Ratio: 5.3 ratio — ABNORMAL HIGH (ref 0.0–5.0)
Cholesterol, Total: 202 mg/dL — ABNORMAL HIGH (ref 100–199)
HDL: 38 mg/dL — ABNORMAL LOW (ref >39)
LDL Chol Calc (NIH): 111 mg/dL — ABNORMAL HIGH (ref 0–99)
Triglycerides: 304 mg/dL — ABNORMAL HIGH (ref 0–149)
VLDL Cholesterol Cal: 53 mg/dL — ABNORMAL HIGH (ref 5–40)

## 2024-02-25 LAB — HEMOGLOBIN A1C
Est. average glucose Bld gHb Est-mCnc: 117 mg/dL
Hgb A1c MFr Bld: 5.7 % — ABNORMAL HIGH (ref 4.8–5.6)

## 2024-03-05 LAB — GENECONNECT MOLECULAR SCREEN: Genetic Analysis Overall Interpretation: NEGATIVE

## 2024-03-31 ENCOUNTER — Encounter: Payer: Self-pay | Admitting: Family Medicine

## 2024-04-01 ENCOUNTER — Other Ambulatory Visit: Payer: Self-pay | Admitting: Family Medicine

## 2024-04-01 MED ORDER — WEGOVY 0.25 MG/0.5ML ~~LOC~~ SOAJ: SUBCUTANEOUS | 0 refills | Status: DC

## 2024-04-01 NOTE — Telephone Encounter (Signed)
Nurses-Please see previous message

## 2024-04-01 NOTE — Telephone Encounter (Signed)
 Nurses Wegovy  was sent to [facility] More than likely it will end up having to go through the prior approval process Pharmacy will notify us  then have the pharmacy authorization team work with this prescription Diagnosis would be morbid obesity  Also please go ahead with dietary consult  Send patient notification that these things have been done  Once the medicine is approved he is to do 0.25 mg subcutaneous once a week and around week 3 or week for he needs to let us  know how that is going and if it is going well we recommend going up on the dose If any problems last month

## 2024-04-04 ENCOUNTER — Telehealth: Payer: Self-pay | Admitting: Pharmacy Technician

## 2024-04-04 ENCOUNTER — Other Ambulatory Visit (HOSPITAL_COMMUNITY): Payer: Self-pay

## 2024-04-04 NOTE — Telephone Encounter (Signed)
 Pharmacy Patient Advocate Encounter   Received notification from CoverMyMeds that prior authorization for Wegovy  0.25MG /0.5ML auto-injectors is required/requested.   Insurance verification completed.   The patient is insured through [facility].   Per test claim: PA required; PA submitted to above mentioned insurance via Latent Key/confirmation #/EOC [id number] Status is pending

## 2024-04-05 ENCOUNTER — Other Ambulatory Visit (HOSPITAL_COMMUNITY): Payer: Self-pay

## 2024-04-07 ENCOUNTER — Other Ambulatory Visit (HOSPITAL_COMMUNITY): Payer: Self-pay

## 2024-04-07 ENCOUNTER — Encounter: Payer: Self-pay | Admitting: Nurse Practitioner

## 2024-04-07 NOTE — Telephone Encounter (Signed)
 Pharmacy Patient Advocate Encounter  Received notification from [facility] that Prior Authorization for Wegovy  0.25MG /0.5ML auto-injectors has been APPROVED from 04/04/2024 to 10/03/2024. Ran test claim, Copay is $0.00. This test claim was processed through [facility]- copay amounts may vary at other pharmacies due to pharmacy/plan contracts, or as the patient moves through the different stages of their insurance plan.   PA #/Case ID/Reference #: [id number]

## 2024-04-29 ENCOUNTER — Telehealth: Payer: Self-pay | Admitting: Pharmacy Technician

## 2024-04-29 ENCOUNTER — Other Ambulatory Visit: Payer: Self-pay | Admitting: Family Medicine

## 2024-04-29 ENCOUNTER — Other Ambulatory Visit (HOSPITAL_COMMUNITY): Payer: Self-pay

## 2024-04-29 ENCOUNTER — Other Ambulatory Visit: Payer: Self-pay | Admitting: Nurse Practitioner

## 2024-04-29 MED ORDER — WEGOVY 0.5 MG/0.5ML ~~LOC~~ SOAJ: 0.5000 mg | SUBCUTANEOUS | 2 refills | Status: DC

## 2024-04-29 NOTE — Telephone Encounter (Signed)
 Pharmacy Patient Advocate Encounter   Received notification from Onbase that prior authorization for Wegovy  0.5mg /0.50ml auto-injectors is required/requested.   Insurance verification completed.   The patient is insured through [employer].   Per test claim: PA required; PA submitted to above mentioned insurance via [email or web address] Key/confirmation #/EOC   Status is pending

## 2024-05-04 ENCOUNTER — Other Ambulatory Visit (HOSPITAL_COMMUNITY): Payer: Self-pay

## 2024-05-05 ENCOUNTER — Other Ambulatory Visit (HOSPITAL_COMMUNITY): Payer: Self-pay

## 2024-05-05 NOTE — Telephone Encounter (Signed)
 Pharmacy Patient Advocate Encounter  Received notification from [facility] that Prior Authorization for Wegovy  0.5mg /0.30ml auto-injectors has been APPROVED from 04/04/2024 to 06/26/2024.   Member is required to use one of the preferred pharmacies listed below.    [facility]* [phone] [place] [phone]  [facility] [address] [place] [phone] [address] [facility] [address] [place] [phone] [facility] [address]  [address] [place] [phone]

## 2024-05-06 ENCOUNTER — Other Ambulatory Visit: Payer: Self-pay | Admitting: Nurse Practitioner

## 2024-05-06 ENCOUNTER — Encounter: Payer: Self-pay | Admitting: Nurse Practitioner

## 2024-05-06 MED ORDER — WEGOVY 0.5 MG/0.5ML ~~LOC~~ SOAJ: 0.5000 mg | SUBCUTANEOUS | 2 refills | Status: DC

## 2024-05-06 NOTE — Telephone Encounter (Signed)
 Mychart message sent.

## 2024-05-26 ENCOUNTER — Other Ambulatory Visit: Payer: Self-pay | Admitting: Nurse Practitioner

## 2024-05-26 ENCOUNTER — Telehealth: Payer: Self-pay

## 2024-05-26 MED ORDER — WEGOVY 0.5 MG/0.5ML ~~LOC~~ SOAJ: 0.5000 mg | SUBCUTANEOUS | 2 refills | Status: AC

## 2024-05-26 NOTE — Telephone Encounter (Signed)
 Done

## 2024-05-26 NOTE — Telephone Encounter (Signed)
 Copied from CRM #[id number]. Topic: Clinical - Medication Refill >> May 26, 2024 10:21 AM Roselie BROCKS wrote: Medication: semaglutide -weight management (WEGOVY ) 0.5 MG/0.5ML SOAJ SQ injection 0.25    Has the patient contacted their pharmacy? Yes (Agent: If no, request that the patient contact the pharmacy for the refill. If patient does not wish to contact the pharmacy document the reason why and proceed with request.) (Agent: If yes, when and what did the pharmacy advise?)  This is the patient's preferred pharmacy:   [facility]* [address] [place] [phone]   Is this the correct pharmacy for this prescription? Yes If no, delete pharmacy and type the correct one.   Has the prescription been filled recently? No  Is the patient out of the medication? No  Has the patient been seen for an appointment in the last year OR does the patient have an upcoming appointment? Yes  Can we respond through MyChart? No  Agent: Please be advised that Rx refills may take up to 3 business days. We ask that you follow-up with your pharmacy.

## 2024-06-14 ENCOUNTER — Ambulatory Visit: Admitting: Nurse Practitioner

## 2024-09-13 ENCOUNTER — Ambulatory Visit: Admitting: "Endocrinology

## 2025-01-16 ENCOUNTER — Ambulatory Visit: Admitting: Family Medicine

## 2025-01-18 ENCOUNTER — Ambulatory Visit: Admitting: Family Medicine
